# Patient Record
Sex: Female | Born: 1985 | Race: Black or African American | Hispanic: No | Marital: Single | Smoking: Never smoker
Health system: Southern US, Community
[De-identification: ages and names within clinical notes are randomized; demographics above are authoritative.]

## PROBLEM LIST (undated history)

## (undated) DIAGNOSIS — I10 Essential (primary) hypertension: Secondary | ICD-10-CM

## (undated) DIAGNOSIS — E119 Type 2 diabetes mellitus without complications: Secondary | ICD-10-CM

## (undated) HISTORY — DX: Essential (primary) hypertension: I10

## (undated) HISTORY — PX: OTHER SURGICAL HISTORY: SHX169

## (undated) HISTORY — PX: APPENDECTOMY: SHX54

---
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
## 2005-05-15 ENCOUNTER — Ambulatory Visit: Payer: Self-pay

## 2005-07-06 ENCOUNTER — Ambulatory Visit: Payer: Self-pay | Admitting: Internal Medicine

## 2005-07-24 ENCOUNTER — Ambulatory Visit: Payer: Self-pay | Admitting: Internal Medicine

## 2005-11-15 ENCOUNTER — Ambulatory Visit: Payer: Self-pay | Admitting: Internal Medicine

## 2005-11-21 ENCOUNTER — Ambulatory Visit: Payer: Self-pay | Admitting: Internal Medicine

## 2005-12-22 ENCOUNTER — Ambulatory Visit: Payer: Self-pay | Admitting: Internal Medicine

## 2006-09-09 ENCOUNTER — Emergency Department: Payer: Self-pay | Admitting: Emergency Medicine

## 2006-11-13 ENCOUNTER — Emergency Department: Payer: Self-pay | Admitting: Emergency Medicine

## 2007-01-08 ENCOUNTER — Inpatient Hospital Stay: Payer: Self-pay | Admitting: Internal Medicine

## 2007-01-23 DIAGNOSIS — E10319 Type 1 diabetes mellitus with unspecified diabetic retinopathy without macular edema: Secondary | ICD-10-CM | POA: Insufficient documentation

## 2007-01-23 DIAGNOSIS — E109 Type 1 diabetes mellitus without complications: Secondary | ICD-10-CM | POA: Insufficient documentation

## 2007-05-21 ENCOUNTER — Emergency Department: Payer: Self-pay | Admitting: Internal Medicine

## 2007-12-01 ENCOUNTER — Emergency Department: Payer: Self-pay | Admitting: Emergency Medicine

## 2007-12-11 ENCOUNTER — Encounter: Payer: Self-pay | Admitting: General Practice

## 2007-12-23 ENCOUNTER — Encounter: Payer: Self-pay | Admitting: General Practice

## 2008-01-22 ENCOUNTER — Encounter: Payer: Self-pay | Admitting: General Practice

## 2008-02-22 ENCOUNTER — Encounter: Payer: Self-pay | Admitting: General Practice

## 2008-05-02 ENCOUNTER — Emergency Department: Payer: Self-pay | Admitting: Emergency Medicine

## 2008-06-03 ENCOUNTER — Emergency Department: Payer: Self-pay | Admitting: Emergency Medicine

## 2008-08-25 ENCOUNTER — Emergency Department: Payer: Self-pay | Admitting: Internal Medicine

## 2009-03-07 ENCOUNTER — Inpatient Hospital Stay: Payer: Self-pay | Admitting: Internal Medicine

## 2009-11-03 ENCOUNTER — Emergency Department: Payer: Self-pay | Admitting: Emergency Medicine

## 2009-11-04 ENCOUNTER — Inpatient Hospital Stay: Payer: Self-pay | Admitting: Internal Medicine

## 2010-09-18 ENCOUNTER — Emergency Department: Payer: Self-pay | Admitting: Emergency Medicine

## 2010-10-25 ENCOUNTER — Emergency Department: Payer: Self-pay | Admitting: Emergency Medicine

## 2010-12-05 ENCOUNTER — Ambulatory Visit: Payer: Self-pay | Admitting: Endocrinology

## 2010-12-23 ENCOUNTER — Ambulatory Visit: Payer: Self-pay | Admitting: Endocrinology

## 2011-01-25 ENCOUNTER — Inpatient Hospital Stay: Payer: Self-pay | Admitting: Internal Medicine

## 2011-01-30 ENCOUNTER — Ambulatory Visit: Payer: Self-pay | Admitting: Endocrinology

## 2011-02-22 ENCOUNTER — Ambulatory Visit: Payer: Self-pay | Admitting: Endocrinology

## 2011-03-25 ENCOUNTER — Ambulatory Visit: Payer: Self-pay | Admitting: Endocrinology

## 2011-04-24 ENCOUNTER — Ambulatory Visit: Payer: Self-pay | Admitting: Endocrinology

## 2011-11-17 ENCOUNTER — Emergency Department: Payer: Self-pay | Admitting: Emergency Medicine

## 2011-11-17 LAB — URINALYSIS, COMPLETE
Bilirubin,UR: NEGATIVE
Blood: NEGATIVE
Leukocyte Esterase: NEGATIVE
Ph: 6 (ref 4.5–8.0)
Specific Gravity: 1.033 (ref 1.003–1.030)

## 2011-11-17 LAB — COMPREHENSIVE METABOLIC PANEL
Albumin: 3.8 g/dL (ref 3.4–5.0)
Alkaline Phosphatase: 101 U/L (ref 50–136)
BUN: 10 mg/dL (ref 7–18)
Chloride: 102 mmol/L (ref 98–107)
EGFR (Non-African Amer.): >60
Glucose: 189 mg/dL — ABNORMAL HIGH (ref 65–99)
Osmolality: 274 (ref 275–301)
SGOT(AST): 22 U/L (ref 15–37)
SGPT (ALT): 22 U/L
Sodium: 135 mmol/L — ABNORMAL LOW (ref 136–145)

## 2011-11-17 LAB — CBC
HCT: 38.7 % (ref 35.0–47.0)
MCH: 31.3 pg (ref 26.0–34.0)
MCV: 94 fL (ref 80–100)
Platelet: 390 10*3/uL (ref 150–440)
RBC: 4.14 10*6/uL (ref 3.80–5.20)

## 2011-12-31 ENCOUNTER — Emergency Department: Payer: Self-pay | Admitting: Emergency Medicine

## 2012-03-08 ENCOUNTER — Inpatient Hospital Stay: Payer: Self-pay | Admitting: Internal Medicine

## 2012-03-08 LAB — URINALYSIS, COMPLETE
Blood: NEGATIVE
Leukocyte Esterase: NEGATIVE
Nitrite: NEGATIVE
Ph: 5 (ref 4.5–8.0)
Protein: NEGATIVE
RBC,UR: 1 /HPF (ref 0–5)
WBC UR: 1 /HPF (ref 0–5)

## 2012-03-08 LAB — HEPATIC FUNCTION PANEL A (ARMC)
Albumin: 3.8 g/dL (ref 3.4–5.0)
Alkaline Phosphatase: 145 U/L — ABNORMAL HIGH (ref 50–136)
Bilirubin, Direct: 0.1 mg/dL (ref 0.00–0.20)
Bilirubin,Total: 0.6 mg/dL (ref 0.2–1.0)
SGPT (ALT): 22 U/L (ref 12–78)
Total Protein: 8.3 g/dL — ABNORMAL HIGH (ref 6.4–8.2)

## 2012-03-08 LAB — LIPASE, BLOOD: Lipase: 54 U/L — ABNORMAL LOW (ref 73–393)

## 2012-03-08 LAB — PREGNANCY, URINE: Pregnancy Test, Urine: NEGATIVE m[IU]/mL

## 2012-03-08 LAB — CBC
HCT: 41.7 % (ref 35.0–47.0)
HGB: 13.1 g/dL (ref 12.0–16.0)
MCH: 31.1 pg (ref 26.0–34.0)
MCHC: 31.4 g/dL — ABNORMAL LOW (ref 32.0–36.0)
RBC: 4.21 10*6/uL (ref 3.80–5.20)
RDW: 13.1 % (ref 11.5–14.5)
WBC: 28.9 10*3/uL — ABNORMAL HIGH (ref 3.6–11.0)

## 2012-03-08 LAB — BASIC METABOLIC PANEL
Anion Gap: 23 — ABNORMAL HIGH (ref 7–16)
Anion Gap: 9 (ref 7–16)
Calcium, Total: 9.1 mg/dL (ref 8.5–10.1)
Calcium, Total: 7.9 mg/dL — ABNORMAL LOW (ref 8.5–10.1)
Chloride: 98 mmol/L (ref 98–107)
Chloride: 112 mmol/L — ABNORMAL HIGH (ref 98–107)
Co2: 10 mmol/L — CL (ref 21–32)
Co2: 18 mmol/L — ABNORMAL LOW (ref 21–32)
Creatinine: 1.21 mg/dL (ref 0.60–1.30)
Creatinine: 0.86 mg/dL (ref 0.60–1.30)
EGFR (African American): >60
Glucose: 200 mg/dL — ABNORMAL HIGH (ref 65–99)
Osmolality: 292 (ref 275–301)
Potassium: 5.8 mmol/L — ABNORMAL HIGH (ref 3.5–5.1)

## 2012-03-08 LAB — HEMOGLOBIN: HGB: 11.5 g/dL — ABNORMAL LOW (ref 12.0–16.0)

## 2012-03-08 LAB — HEMOGLOBIN A1C: Hemoglobin A1C: 7.8 % — ABNORMAL HIGH (ref 4.2–6.3)

## 2012-03-09 LAB — CBC WITH DIFFERENTIAL/PLATELET
Basophil %: 0.3 %
Eosinophil #: 0 10*3/uL (ref 0.0–0.7)
HCT: 33 % — ABNORMAL LOW (ref 35.0–47.0)
HGB: 10.9 g/dL — ABNORMAL LOW (ref 12.0–16.0)
Lymphocyte #: 3.1 10*3/uL (ref 1.0–3.6)
Lymphocyte %: 15.3 %
MCH: 31.3 pg (ref 26.0–34.0)
MCHC: 33.1 g/dL (ref 32.0–36.0)
Monocyte #: 1.5 x10 3/mm — ABNORMAL HIGH (ref 0.2–0.9)
Neutrophil #: 15.4 10*3/uL — ABNORMAL HIGH (ref 1.4–6.5)
Platelet: 389 10*3/uL (ref 150–440)
RDW: 12.8 % (ref 11.5–14.5)

## 2012-03-09 LAB — BASIC METABOLIC PANEL
Anion Gap: 7 (ref 7–16)
BUN: 11 mg/dL (ref 7–18)
BUN: 8 mg/dL (ref 7–18)
Calcium, Total: 7.6 mg/dL — ABNORMAL LOW (ref 8.5–10.1)
Calcium, Total: 7.8 mg/dL — ABNORMAL LOW (ref 8.5–10.1)
Calcium, Total: 7.6 mg/dL — ABNORMAL LOW (ref 8.5–10.1)
Chloride: 112 mmol/L — ABNORMAL HIGH (ref 98–107)
Chloride: 113 mmol/L — ABNORMAL HIGH (ref 98–107)
Co2: 20 mmol/L — ABNORMAL LOW (ref 21–32)
Co2: 21 mmol/L (ref 21–32)
Creatinine: 0.87 mg/dL (ref 0.60–1.30)
EGFR (African American): >60
EGFR (African American): >60
EGFR (African American): >60
EGFR (Non-African Amer.): >60
EGFR (Non-African Amer.): >60
EGFR (Non-African Amer.): >60
Glucose: 87 mg/dL (ref 65–99)
Glucose: 103 mg/dL — ABNORMAL HIGH (ref 65–99)
Glucose: 187 mg/dL — ABNORMAL HIGH (ref 65–99)
Osmolality: 282 (ref 275–301)
Potassium: 3.5 mmol/L (ref 3.5–5.1)
Potassium: 3.7 mmol/L (ref 3.5–5.1)
Potassium: 3.9 mmol/L (ref 3.5–5.1)
Sodium: 142 mmol/L (ref 136–145)
Sodium: 142 mmol/L (ref 136–145)
Sodium: 141 mmol/L (ref 136–145)

## 2012-03-09 LAB — COMPREHENSIVE METABOLIC PANEL
Alkaline Phosphatase: 100 U/L (ref 50–136)
Anion Gap: 15 (ref 7–16)
Calcium, Total: 7.9 mg/dL — ABNORMAL LOW (ref 8.5–10.1)
Chloride: 111 mmol/L — ABNORMAL HIGH (ref 98–107)
Co2: 17 mmol/L — ABNORMAL LOW (ref 21–32)
EGFR (Non-African Amer.): >60
Potassium: 4 mmol/L (ref 3.5–5.1)
SGOT(AST): 8 U/L — ABNORMAL LOW (ref 15–37)
SGPT (ALT): 16 U/L (ref 12–78)
Total Protein: 5.6 g/dL — ABNORMAL LOW (ref 6.4–8.2)

## 2012-03-09 LAB — TROPONIN I
Troponin-I: <0.02 ng/mL
Troponin-I: 0.03 ng/mL

## 2012-03-09 LAB — HEMOGLOBIN: HGB: 11.2 g/dL — ABNORMAL LOW (ref 12.0–16.0)

## 2012-03-10 LAB — BASIC METABOLIC PANEL
Chloride: 109 mmol/L — ABNORMAL HIGH (ref 98–107)
Co2: 23 mmol/L (ref 21–32)
Creatinine: 0.54 mg/dL — ABNORMAL LOW (ref 0.60–1.30)
EGFR (African American): >60
Sodium: 138 mmol/L (ref 136–145)

## 2012-03-11 LAB — BASIC METABOLIC PANEL
Anion Gap: 9 (ref 7–16)
BUN: 7 mg/dL (ref 7–18)
Calcium, Total: 8.3 mg/dL — ABNORMAL LOW (ref 8.5–10.1)
Co2: 25 mmol/L (ref 21–32)
EGFR (African American): >60
EGFR (Non-African Amer.): >60
Glucose: 281 mg/dL — ABNORMAL HIGH (ref 65–99)
Osmolality: 284 (ref 275–301)
Potassium: 3.6 mmol/L (ref 3.5–5.1)

## 2012-03-11 LAB — HEMOGLOBIN: HGB: 11.4 g/dL — ABNORMAL LOW (ref 12.0–16.0)

## 2012-07-01 ENCOUNTER — Emergency Department: Payer: Self-pay | Admitting: Emergency Medicine

## 2012-07-01 LAB — CBC
HCT: 37.3 % (ref 35.0–47.0)
HGB: 12.6 g/dL (ref 12.0–16.0)
MCHC: 33.6 g/dL (ref 32.0–36.0)
MCV: 94 fL (ref 80–100)
Platelet: 360 10*3/uL (ref 150–440)
RDW: 12.7 % (ref 11.5–14.5)

## 2012-07-01 LAB — URINALYSIS, COMPLETE
Bilirubin,UR: NEGATIVE
Glucose,UR: >=500 mg/dL (ref 0–75)
Hyaline Cast: 5
Ketone: NEGATIVE
Leukocyte Esterase: NEGATIVE
Protein: 100
Specific Gravity: 1.032 (ref 1.003–1.030)
WBC UR: 2 /HPF (ref 0–5)

## 2012-07-01 LAB — COMPREHENSIVE METABOLIC PANEL
Anion Gap: 7 (ref 7–16)
BUN: 8 mg/dL (ref 7–18)
Bilirubin,Total: 0.5 mg/dL (ref 0.2–1.0)
Chloride: 104 mmol/L (ref 98–107)
Creatinine: 0.69 mg/dL (ref 0.60–1.30)
EGFR (African American): >60
EGFR (Non-African Amer.): >60
Osmolality: 283 (ref 275–301)
Potassium: 4 mmol/L (ref 3.5–5.1)
SGOT(AST): 18 U/L (ref 15–37)
Sodium: 136 mmol/L (ref 136–145)
Total Protein: 6.9 g/dL (ref 6.4–8.2)

## 2012-07-01 LAB — LIPASE, BLOOD: Lipase: 54 U/L — ABNORMAL LOW (ref 73–393)

## 2012-07-01 LAB — PREGNANCY, URINE: Pregnancy Test, Urine: NEGATIVE m[IU]/mL

## 2012-09-14 ENCOUNTER — Emergency Department: Payer: Self-pay | Admitting: Emergency Medicine

## 2012-09-14 LAB — COMPREHENSIVE METABOLIC PANEL
Albumin: 3.6 g/dL (ref 3.4–5.0)
Alkaline Phosphatase: 107 U/L (ref 50–136)
Anion Gap: 12 (ref 7–16)
Chloride: 101 mmol/L (ref 98–107)
Co2: 20 mmol/L — ABNORMAL LOW (ref 21–32)
Creatinine: 0.61 mg/dL (ref 0.60–1.30)
EGFR (African American): >60
Glucose: 325 mg/dL — ABNORMAL HIGH (ref 65–99)
SGPT (ALT): 22 U/L (ref 12–78)
Sodium: 133 mmol/L — ABNORMAL LOW (ref 136–145)
Total Protein: 7.4 g/dL (ref 6.4–8.2)

## 2012-09-14 LAB — URINALYSIS, COMPLETE
Bacteria: NONE SEEN
Bilirubin,UR: NEGATIVE
Blood: NEGATIVE
Glucose,UR: >=500 mg/dL (ref 0–75)
Leukocyte Esterase: NEGATIVE
Nitrite: NEGATIVE
Ph: 5 (ref 4.5–8.0)
RBC,UR: 1 /HPF (ref 0–5)
Squamous Epithelial: 1

## 2012-09-14 LAB — CBC
HCT: 39.8 % (ref 35.0–47.0)
HGB: 13.5 g/dL (ref 12.0–16.0)
MCV: 96 fL (ref 80–100)

## 2012-09-14 LAB — LIPASE, BLOOD: Lipase: 56 U/L — ABNORMAL LOW (ref 73–393)

## 2012-12-12 ENCOUNTER — Inpatient Hospital Stay: Payer: Self-pay | Admitting: Specialist

## 2012-12-12 ENCOUNTER — Other Ambulatory Visit: Payer: Self-pay | Admitting: Physician Assistant

## 2012-12-12 LAB — COMPREHENSIVE METABOLIC PANEL
Albumin: 3.4 g/dL (ref 3.4–5.0)
Alkaline Phosphatase: 118 U/L (ref 50–136)
Anion Gap: 9 (ref 7–16)
Anion Gap: 13 (ref 7–16)
BUN: 12 mg/dL (ref 7–18)
BUN: 17 mg/dL (ref 7–18)
Bilirubin,Total: 0.8 mg/dL (ref 0.2–1.0)
Chloride: 101 mmol/L (ref 98–107)
Chloride: 97 mmol/L — ABNORMAL LOW (ref 98–107)
Co2: 18 mmol/L — ABNORMAL LOW (ref 21–32)
Creatinine: 0.63 mg/dL (ref 0.60–1.30)
EGFR (African American): >60
EGFR (Non-African Amer.): >60
EGFR (Non-African Amer.): >60
Glucose: 359 mg/dL — ABNORMAL HIGH (ref 65–99)
Osmolality: 279 (ref 275–301)
Osmolality: 277 (ref 275–301)
Potassium: 4.3 mmol/L (ref 3.5–5.1)
SGOT(AST): 13 U/L — ABNORMAL LOW (ref 15–37)
SGOT(AST): 17 U/L (ref 15–37)
SGPT (ALT): 19 U/L (ref 12–78)
Total Protein: 7.3 g/dL (ref 6.4–8.2)
Total Protein: 7.8 g/dL (ref 6.4–8.2)

## 2012-12-12 LAB — BASIC METABOLIC PANEL
Anion Gap: 15 (ref 7–16)
BUN: 15 mg/dL (ref 7–18)
Calcium, Total: 8.4 mg/dL — ABNORMAL LOW (ref 8.5–10.1)
Creatinine: 0.78 mg/dL (ref 0.60–1.30)
EGFR (African American): >60
EGFR (Non-African Amer.): >60
Osmolality: 277 (ref 275–301)
Potassium: 4.8 mmol/L (ref 3.5–5.1)
Sodium: 131 mmol/L — ABNORMAL LOW (ref 136–145)

## 2012-12-12 LAB — CBC WITH DIFFERENTIAL/PLATELET
Basophil #: 0.1 10*3/uL (ref 0.0–0.1)
Eosinophil %: 1.1 %
MCHC: 34 g/dL (ref 32.0–36.0)
Monocyte #: 0.5 x10 3/mm (ref 0.2–0.9)
Monocyte %: 3.5 %
Neutrophil #: 11.8 10*3/uL — ABNORMAL HIGH (ref 1.4–6.5)
Neutrophil %: 83.5 %
Platelet: 390 10*3/uL (ref 150–440)
RBC: 3.94 10*6/uL (ref 3.80–5.20)
RDW: 12.9 % (ref 11.5–14.5)

## 2012-12-12 LAB — TSH: Thyroid Stimulating Horm: 0.598 u[IU]/mL

## 2012-12-12 LAB — CBC
HCT: 36.7 % (ref 35.0–47.0)
MCH: 31.5 pg (ref 26.0–34.0)
MCHC: 33.6 g/dL (ref 32.0–36.0)
MCV: 94 fL (ref 80–100)
Platelet: 412 10*3/uL (ref 150–440)
RBC: 3.92 10*6/uL (ref 3.80–5.20)
RDW: 12.7 % (ref 11.5–14.5)
WBC: 13.7 10*3/uL — ABNORMAL HIGH (ref 3.6–11.0)

## 2012-12-12 LAB — URINALYSIS, COMPLETE
Leukocyte Esterase: NEGATIVE
Ph: 5 (ref 4.5–8.0)
Protein: NEGATIVE
RBC,UR: 1 /HPF (ref 0–5)
Specific Gravity: 1.025 (ref 1.003–1.030)
Squamous Epithelial: 1

## 2012-12-12 LAB — LIPID PANEL: VLDL Cholesterol, Calc: 14 mg/dL (ref 5–40)

## 2012-12-12 LAB — HEMOGLOBIN A1C: Hemoglobin A1C: 9.1 % — ABNORMAL HIGH (ref 4.2–6.3)

## 2012-12-13 LAB — BASIC METABOLIC PANEL
BUN: 11 mg/dL (ref 7–18)
BUN: 7 mg/dL (ref 7–18)
Calcium, Total: 8 mg/dL — ABNORMAL LOW (ref 8.5–10.1)
Chloride: 110 mmol/L — ABNORMAL HIGH (ref 98–107)
Co2: 10 mmol/L — CL (ref 21–32)
EGFR (African American): >60
Glucose: 231 mg/dL — ABNORMAL HIGH (ref 65–99)

## 2013-01-19 ENCOUNTER — Emergency Department: Payer: Self-pay | Admitting: Internal Medicine

## 2013-01-19 LAB — BASIC METABOLIC PANEL
Anion Gap: 10 (ref 7–16)
Calcium, Total: 8.8 mg/dL (ref 8.5–10.1)
Chloride: 101 mmol/L (ref 98–107)
EGFR (Non-African Amer.): >60
Potassium: 4.4 mmol/L (ref 3.5–5.1)

## 2013-01-19 LAB — CBC
HCT: 38.7 % (ref 35.0–47.0)
HGB: 13.5 g/dL (ref 12.0–16.0)
MCH: 32.1 pg (ref 26.0–34.0)
MCV: 92 fL (ref 80–100)
Platelet: 320 10*3/uL (ref 150–440)
RBC: 4.19 10*6/uL (ref 3.80–5.20)
RDW: 12.6 % (ref 11.5–14.5)

## 2013-04-25 ENCOUNTER — Emergency Department: Payer: Self-pay | Admitting: Emergency Medicine

## 2013-04-25 LAB — URINALYSIS, COMPLETE
Bilirubin,UR: NEGATIVE
Glucose,UR: >=500 mg/dL (ref 0–75)
Nitrite: NEGATIVE
Protein: NEGATIVE
RBC,UR: <1 /HPF (ref 0–5)
Specific Gravity: 1.014 (ref 1.003–1.030)
Squamous Epithelial: 2
WBC UR: 1 /HPF (ref 0–5)

## 2013-04-25 LAB — BASIC METABOLIC PANEL
Anion Gap: 6 — ABNORMAL LOW (ref 7–16)
BUN: 9 mg/dL (ref 7–18)
Chloride: 108 mmol/L — ABNORMAL HIGH (ref 98–107)
Co2: 24 mmol/L (ref 21–32)
Creatinine: 0.61 mg/dL (ref 0.60–1.30)
EGFR (African American): >60
EGFR (Non-African Amer.): >60
Osmolality: 270 (ref 275–301)
Potassium: 3.5 mmol/L (ref 3.5–5.1)

## 2013-04-25 LAB — CBC
HCT: 36.8 % (ref 35.0–47.0)
HGB: 12.4 g/dL (ref 12.0–16.0)
MCHC: 33.7 g/dL (ref 32.0–36.0)
MCV: 95 fL (ref 80–100)
Platelet: 440 10*3/uL (ref 150–440)
RBC: 3.86 10*6/uL (ref 3.80–5.20)
WBC: 13.6 10*3/uL — ABNORMAL HIGH (ref 3.6–11.0)

## 2013-07-18 ENCOUNTER — Emergency Department: Payer: Self-pay | Admitting: Emergency Medicine

## 2013-07-18 LAB — RAPID INFLUENZA A&B ANTIGENS

## 2013-07-19 ENCOUNTER — Emergency Department: Payer: Self-pay | Admitting: Emergency Medicine

## 2013-07-19 LAB — URINALYSIS, COMPLETE
Bilirubin,UR: NEGATIVE
Blood: NEGATIVE
Glucose,UR: >=500 mg/dL (ref 0–75)
Hyaline Cast: 6
Nitrite: NEGATIVE
Ph: 5 (ref 4.5–8.0)
Protein: 100
Specific Gravity: 1.024 (ref 1.003–1.030)
Squamous Epithelial: 6

## 2013-09-11 ENCOUNTER — Inpatient Hospital Stay: Payer: Self-pay | Admitting: Internal Medicine

## 2013-09-11 LAB — CBC
HCT: 35.5 % (ref 35.0–47.0)
HGB: 11.6 g/dL — AB (ref 12.0–16.0)
MCH: 31.3 pg (ref 26.0–34.0)
MCHC: 32.5 g/dL (ref 32.0–36.0)
MCV: 96 fL (ref 80–100)
PLATELETS: 405 10*3/uL (ref 150–440)
RBC: 3.69 10*6/uL — AB (ref 3.80–5.20)
RDW: 12.7 % (ref 11.5–14.5)
WBC: 13.2 10*3/uL — ABNORMAL HIGH (ref 3.6–11.0)

## 2013-09-11 LAB — COMPREHENSIVE METABOLIC PANEL
ALT: 24 U/L (ref 12–78)
Albumin: 3.2 g/dL — ABNORMAL LOW (ref 3.4–5.0)
Alkaline Phosphatase: 121 U/L — ABNORMAL HIGH
Anion Gap: 5 — ABNORMAL LOW (ref 7–16)
BUN: 12 mg/dL (ref 7–18)
Bilirubin,Total: 0.2 mg/dL (ref 0.2–1.0)
CALCIUM: 8.6 mg/dL (ref 8.5–10.1)
CO2: 29 mmol/L (ref 21–32)
CREATININE: 0.64 mg/dL (ref 0.60–1.30)
Chloride: 100 mmol/L (ref 98–107)
EGFR (African American): >60
EGFR (Non-African Amer.): >60
GLUCOSE: 124 mg/dL — AB (ref 65–99)
Osmolality: 269 (ref 275–301)
Potassium: 4.6 mmol/L (ref 3.5–5.1)
SGOT(AST): 19 U/L (ref 15–37)
Sodium: 134 mmol/L — ABNORMAL LOW (ref 136–145)
Total Protein: 7.2 g/dL (ref 6.4–8.2)

## 2013-09-12 LAB — BASIC METABOLIC PANEL
Anion Gap: 7 (ref 7–16)
BUN: 7 mg/dL (ref 7–18)
CALCIUM: 8.7 mg/dL (ref 8.5–10.1)
CO2: 26 mmol/L (ref 21–32)
CREATININE: 0.64 mg/dL (ref 0.60–1.30)
Chloride: 102 mmol/L (ref 98–107)
EGFR (African American): >60
EGFR (Non-African Amer.): >60
Glucose: 150 mg/dL — ABNORMAL HIGH (ref 65–99)
OSMOLALITY: 271 (ref 275–301)
Potassium: 3.7 mmol/L (ref 3.5–5.1)
Sodium: 135 mmol/L — ABNORMAL LOW (ref 136–145)

## 2013-09-12 LAB — CBC WITH DIFFERENTIAL/PLATELET
BASOS PCT: 0.6 %
Basophil #: 0.1 10*3/uL (ref 0.0–0.1)
Eosinophil #: 0.2 10*3/uL (ref 0.0–0.7)
Eosinophil %: 2.4 %
HCT: 33.5 % — ABNORMAL LOW (ref 35.0–47.0)
HGB: 11.4 g/dL — AB (ref 12.0–16.0)
LYMPHS ABS: 3.2 10*3/uL (ref 1.0–3.6)
LYMPHS PCT: 33.1 %
MCH: 32.6 pg (ref 26.0–34.0)
MCHC: 34 g/dL (ref 32.0–36.0)
MCV: 96 fL (ref 80–100)
Monocyte #: 0.7 x10 3/mm (ref 0.2–0.9)
Monocyte %: 7.5 %
Neutrophil #: 5.4 10*3/uL (ref 1.4–6.5)
Neutrophil %: 56.4 %
PLATELETS: 361 10*3/uL (ref 150–440)
RBC: 3.49 10*6/uL — AB (ref 3.80–5.20)
RDW: 12.7 % (ref 11.5–14.5)
WBC: 9.7 10*3/uL (ref 3.6–11.0)

## 2013-09-12 LAB — URINALYSIS, COMPLETE
Bilirubin,UR: NEGATIVE
Blood: NEGATIVE
Glucose,UR: 150 mg/dL (ref 0–75)
KETONE: NEGATIVE
LEUKOCYTE ESTERASE: NEGATIVE
NITRITE: NEGATIVE
PH: 8 (ref 4.5–8.0)
Protein: NEGATIVE
Specific Gravity: 1.008 (ref 1.003–1.030)
Squamous Epithelial: 1
WBC UR: 1 /HPF (ref 0–5)

## 2013-09-12 LAB — HEMOGLOBIN A1C: Hemoglobin A1C: 6.8 % — ABNORMAL HIGH (ref 4.2–6.3)

## 2013-09-17 LAB — CULTURE, BLOOD (SINGLE)

## 2013-12-04 ENCOUNTER — Other Ambulatory Visit: Payer: Self-pay | Admitting: Obstetrics and Gynecology

## 2013-12-04 LAB — HEMOGLOBIN A1C: Hemoglobin A1C: 7.4 % — ABNORMAL HIGH (ref 4.2–6.3)

## 2013-12-04 LAB — HCG, QUANTITATIVE, PREGNANCY: Beta Hcg, Quant.: 281 m[IU]/mL — ABNORMAL HIGH

## 2013-12-15 ENCOUNTER — Emergency Department: Payer: Self-pay | Admitting: Emergency Medicine

## 2013-12-15 LAB — CBC
HCT: 35.3 % (ref 35.0–47.0)
HGB: 11.8 g/dL — ABNORMAL LOW (ref 12.0–16.0)
MCH: 31.5 pg (ref 26.0–34.0)
MCHC: 33.3 g/dL (ref 32.0–36.0)
MCV: 95 fL (ref 80–100)
PLATELETS: 424 10*3/uL (ref 150–440)
RBC: 3.74 10*6/uL — AB (ref 3.80–5.20)
RDW: 12.7 % (ref 11.5–14.5)
WBC: 10 10*3/uL (ref 3.6–11.0)

## 2013-12-15 LAB — URINALYSIS, COMPLETE
Bacteria: NONE SEEN
Bilirubin,UR: NEGATIVE
GLUCOSE, UR: NEGATIVE mg/dL (ref 0–75)
KETONE: NEGATIVE
Leukocyte Esterase: NEGATIVE
NITRITE: NEGATIVE
Ph: 6 (ref 4.5–8.0)
Protein: NEGATIVE
RBC,UR: NONE SEEN /HPF (ref 0–5)
Specific Gravity: 1.019 (ref 1.003–1.030)
Squamous Epithelial: <1
WBC UR: NONE SEEN /HPF (ref 0–5)

## 2013-12-15 LAB — HCG, QUANTITATIVE, PREGNANCY: Beta Hcg, Quant.: 1872 m[IU]/mL — ABNORMAL HIGH

## 2013-12-16 ENCOUNTER — Other Ambulatory Visit: Payer: Self-pay | Admitting: Obstetrics and Gynecology

## 2013-12-16 LAB — HCG, QUANTITATIVE, PREGNANCY: Beta Hcg, Quant.: 487 m[IU]/mL — ABNORMAL HIGH

## 2013-12-23 ENCOUNTER — Other Ambulatory Visit: Payer: Self-pay | Admitting: Obstetrics and Gynecology

## 2013-12-23 LAB — HCG, QUANTITATIVE, PREGNANCY: Beta Hcg, Quant.: 14 m[IU]/mL — ABNORMAL HIGH

## 2014-05-26 ENCOUNTER — Ambulatory Visit: Payer: Self-pay | Admitting: Obstetrics and Gynecology

## 2014-05-26 LAB — HCG, QUANTITATIVE, PREGNANCY: Beta Hcg, Quant.: <1 m[IU]/mL — ABNORMAL LOW

## 2014-07-01 IMAGING — CR DG CHEST 2V
1 series · 2 of 2 positions shown · non-contrast
Comparison: none

REASON FOR EXAM: DKA, WBC 28.9, Mild dyspnea
COMMENTS:

[Series 1: w chest pa · 0.14mm/px · 2 of 2 slices shown]
[im 1/2]
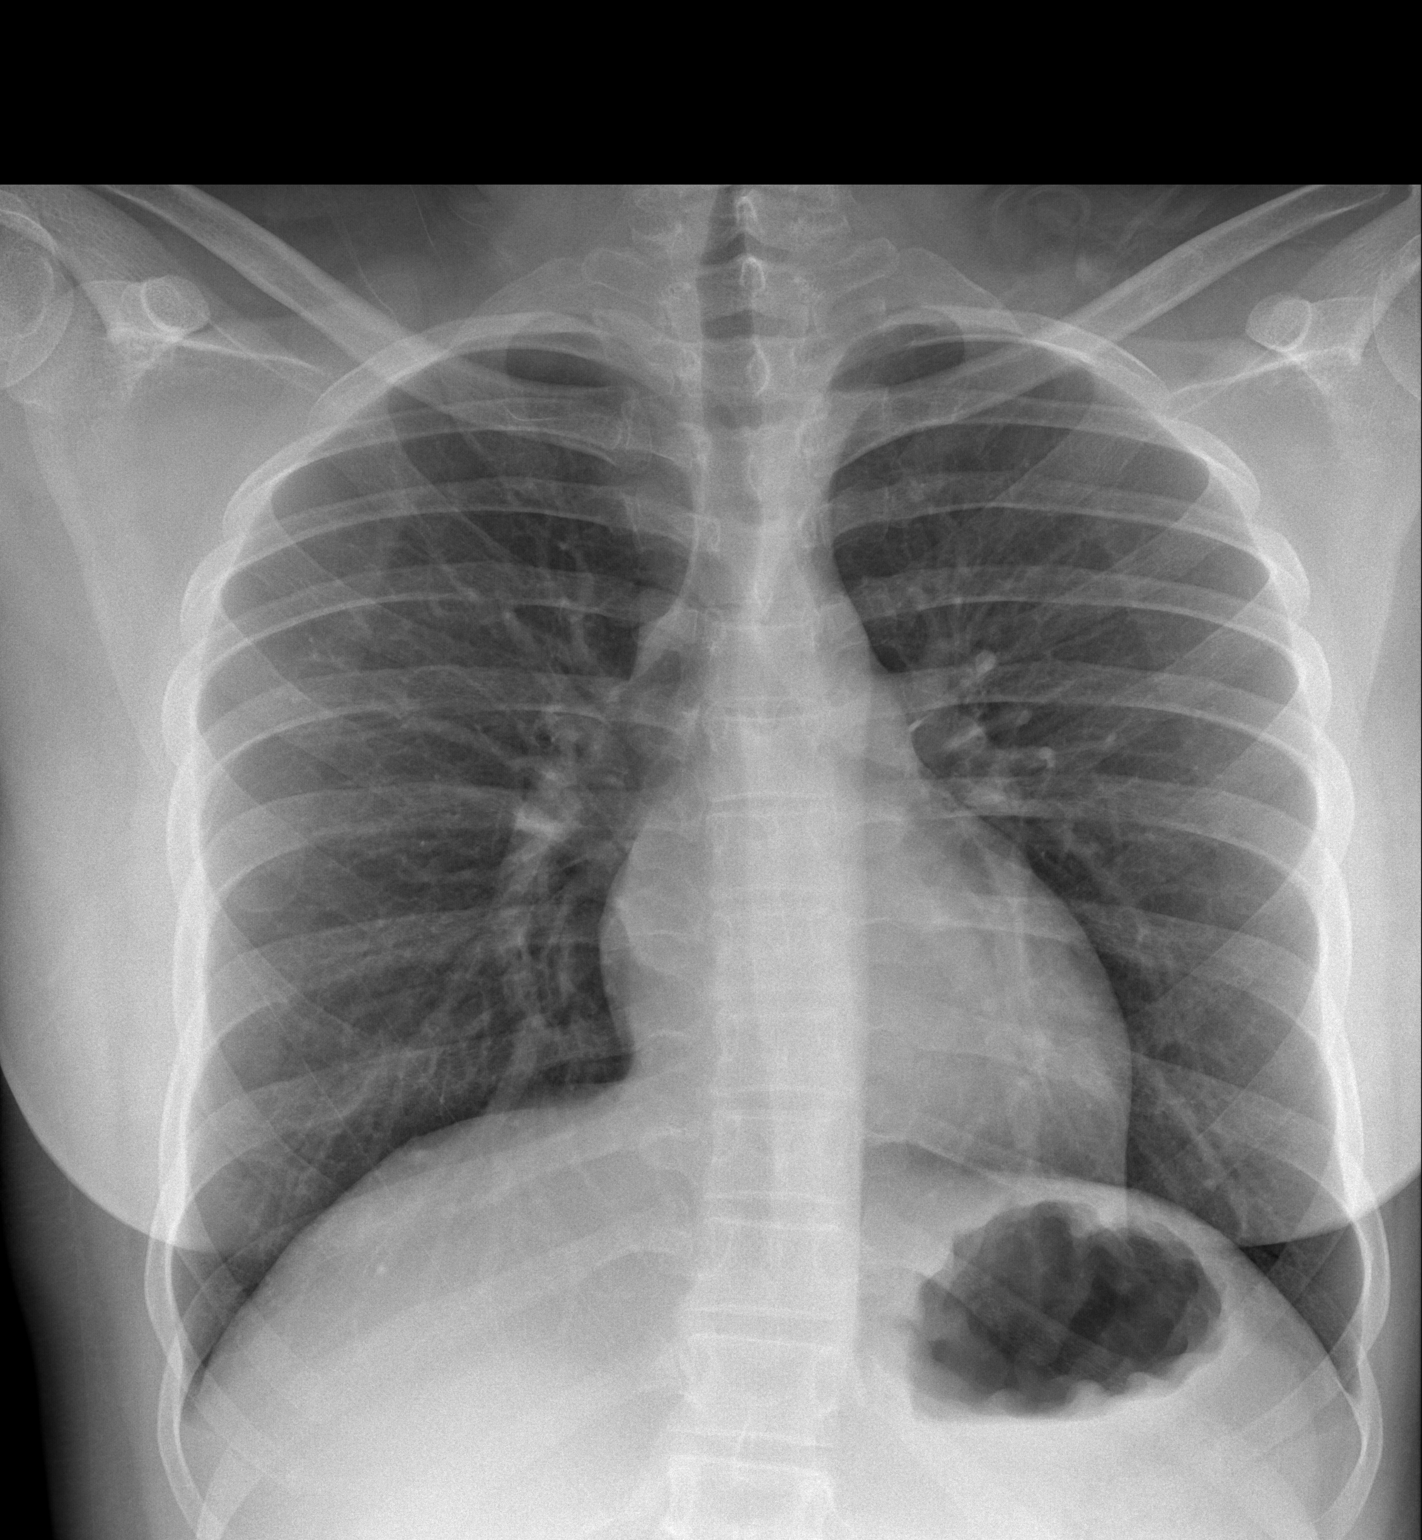
[im 2/2]
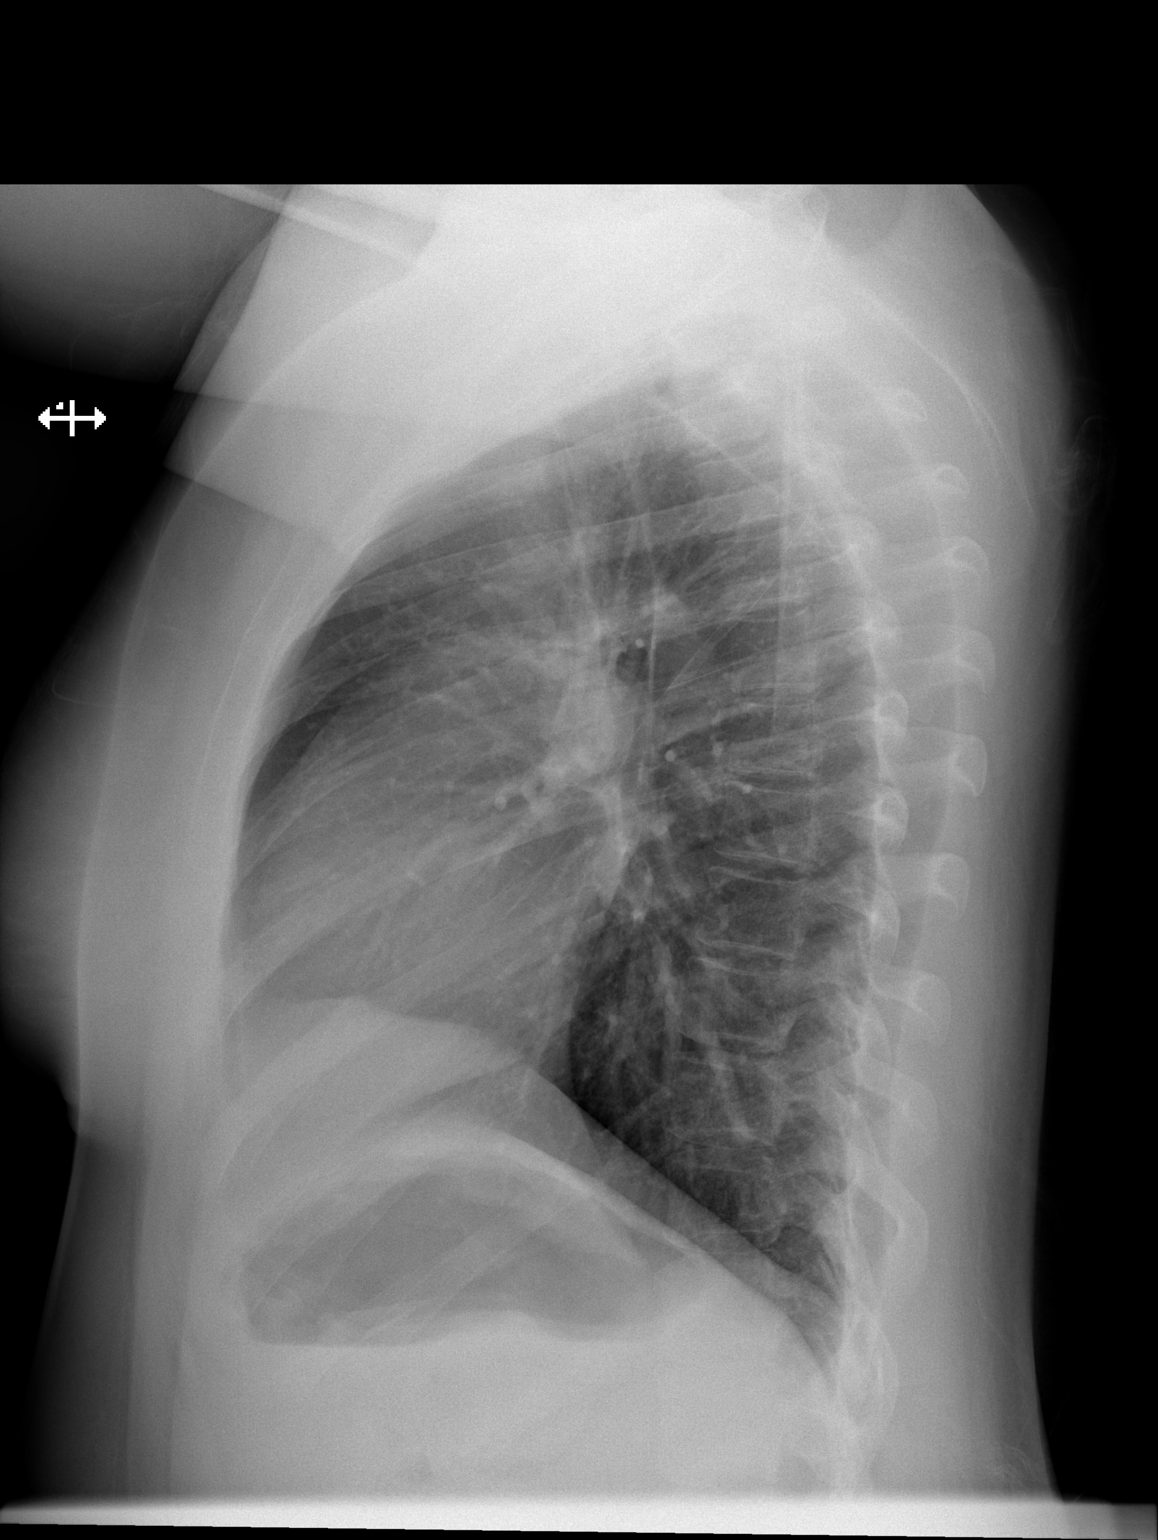

[2 of 2 positions shown; findings below may reference images not displayed]

PROCEDURE:     DXR - DXR CHEST PA (OR AP) AND LATERAL  - March 08, 2012  [DATE]

RESULT:     Comparison is made to the study 05 November, 2009.

The lungs are well-expanded. The cardiac silhouette is normal in size. The
pulmonary vascularity is not engorged. There is no pleural effusion or
pneumothorax. The mediastinum is normal in width. The bony thorax exhibits
no acute abnormality.
IMPRESSION: There is mild hyperinflation which may reflect underlying
reactive airway disease and acute bronchitis. There is no evidence of
pneumonia.

[REDACTED]

## 2014-09-24 ENCOUNTER — Other Ambulatory Visit: Payer: Self-pay | Admitting: Obstetrics and Gynecology

## 2014-09-25 ENCOUNTER — Emergency Department: Payer: Self-pay | Admitting: Emergency Medicine

## 2014-11-10 NOTE — Discharge Summary (Signed)
PATIENT NAME:  Katie Griffin, Katie Griffin MR#:  [id number] DATE OF BIRTH:  [birth year]  DATE OF ADMISSION:  03/08/2012 DATE OF DISCHARGE:  03/11/2012  ADMITTING PHYSICIAN: Dr. Katharina Caperima Vaickute   DISCHARGING PHYSICIAN: Dr. Enid Baasadhika Joevanni Roddey   PRIMARY ENDOCRINOLOGIST: Dr. Elsie SaasSolum   CONSULTATION IN THE HOSPITAL: Endocrinology consultation by Dr. Tedd SiasSolum    DISCHARGE DIAGNOSES:  1. Diabetic ketoacidosis.  2. Type I diabetes mellitus, on insulin pump.  3. Acute viral gastroenteritis.  4. Hypokalemia.  5. Hematemesis due to recurrent vomiting on admission.   DISCHARGE MEDICATIONS:  1. The patient is on insulin pump. 2. Cipro 500 mg p.o. b.i.d. for three more days.   DISCHARGE DIET: ADA carbohydrate controlled 1800 calorie diet.   DISCHARGE ACTIVITY: As tolerated.    FOLLOW-UP INSTRUCTIONS: Follow-up with Dr. Tedd SiasSolum in 2 to 3 weeks.   LABS AND IMAGING STUDIES: Hemoglobin 11.4 prior to discharge. WBC was 20.1 on admission. It improved with fluid. Sodium 138, potassium 3.9, chloride 109, bicarb 23, BUN 5, creatinine 0.5, glucose 270, calcium 7.8.   Chest x-ray on admission showing hyperinflation, possible reactive airway disease or bronchitis.   Glucose was more than 500 on admission and anion gap was 23.   BRIEF HOSPITAL COURSE: [name] is a 29 year old African American female with known history of type I diabetes mellitus on insulin pump who follows up regularly with Dr. Tedd SiasSolum was brought in secondary to nausea, vomiting, and abdominal pain. Her sugars were greater than 500 and she was found to be in DKA. 1. DKA, could be triggered by the viral gastroenteritis. She was started on insulin IV drip in the hospital with closure of her anion gap. She was seen by endocrinologist, Dr. Tedd SiasSolum, to help adjust her insulin pump. Once her gap was closed, she was restarted back on her insulin pump at her home doses. Her recent HbA1c was 7.8 by Dr. Pricilla HandlerSolum's notes. Her sugars have been in the 200 range so Dr. Tedd SiasSolum  prior to discharge has adjusted her settings. Her basal rates at 12 a.m. should be 1.05 units/h and at 1 p.m. is 1.15 units per hour and at 7 p.m. is 1.2 units per hour so the new 24 hour basal rate is calculated to be 26.5 units. The patient will follow-up with Dr. Tedd SiasSolum as an outpatient in the next 2 to 3 weeks.  2. Viral gastroenteritis. Symptoms resolved within one day. She was started on Cipro for her abdominal pain, tenderness and she seemed to have symptomatically improved so Cipro will be continued for another three days to finish off the course. Her course has been otherwise uneventful in the hospital. Her electrolytes were replaced as needed. She was hyperkalemic on admission due to DKA and then potassium was low and that was replaced.  3. Hematemesis from constant vomiting and retching. She had mild streaks of blood on admission. Hemoglobin, however, stayed stable and has not had further episodes of hematemesis while in the hospital.   DISCHARGE CONDITION: Stable.   DISCHARGE DISPOSITION: Home.   TIME SPENT ON DISCHARGE: 45 minutes.   ____________________________ Enid Baasadhika Daviel Allegretto, MD rk:drc D: 03/11/2012 14:59:32 ET T: 03/12/2012 12:06:44 ET JOB#: [id number]  cc: Enid Baasadhika Dasie Chancellor, MD, <Dictator> A. Wendall MolaMelissa Solum, MD Enid BaasADHIKA Rubyann Lingle MD ELECTRONICALLY SIGNED 03/13/2012 12:44

## 2014-11-10 NOTE — Consult Note (Signed)
Chief Complaint and History:   Referring Physician Dr. Ether Griffins    Chief Complaint DKA    History of Present Illness 29 yo AAF with h/o type 1 diabetes seen in consultation for DKA. She has had 1 day of N/V/fevers/headache. Labs showed inital BG 563, creatinine 1.2, bicarb 10, AG 23, WBC 28.9, urine glucose >500 and 2+ urinary ketones c/w DKA. Her current Hgb A1c is 7.8%. Her prior A1c in April was 4.1%. Diabetes has been well controlled recently. She was admitted last year one time with DKA. She has regular clinic follow-up with me and was last seen in April.  Patient has a [employer] insulin pump. Current settings on pump are: Basal rates: 12 AM 0.925 units/hour, 1 PM 0.95 units/hour (24-hour total basal 22.475 units). Bolus settings are: I: C at midnight 10, at 6 PM 8, Sensitivity 45, Target 90-120. Insulin active time 4 hours She last changed her basal settings one week ago because she was having frequent low sugars. She typically checks her sugars 3 times daily.    Past History Medical history: Type 1 diabetes  A. diagnosed age 70  B. Hospitalization for DKA 01/2011  Surgical history: Appendectomy, 1999  Medications: Humalog (2 vials per month) Kariva OCP  Social history: She is single. She lives with her mother. She does not smoke cigarettes or drink alcohol.  Family history: Diabetes and heart disease   Allergies:  No Known Allergies:   Review of Systems:   General see HPI    Skin negative    Breast negative    Ophthalmologic negative    ENMT negative    Respiratory and Thorax negative    Cardiovascular negative    Gastrointestinal see HPI    Musculoskeletal negative    Neurological negative    Psychiatric negative    Endocrine see HPI   Physical Exam:   General WD WN F in NAD    Skin No rash    Eyes EOMI    ENMT OP clear, MMM    Head and Neck Supple    Respiratory and Thorax Clear b/l, no wheeze    Cardiovascular Tachycardic, no murmur     Gastrointestinal Soft, NT    Musculoskeletal Nml motor tone, no leg edema    Psychiatric A&O x3   Laboratory Results:  Hepatic:  16-Aug-13 12:10    Bilirubin, Total 0.6   Bilirubin, Direct 0.1 (Result(s) reported on 08 Mar 2012 at 03:27PM.)   Alkaline Phosphatase  145   SGPT (ALT) 22   SGOT (AST) 27   Total Protein, Serum  8.3   Albumin, Serum 3.8  Routine Chem:  16-Aug-13 12:10    Lipase  54 (Result(s) reported on 08 Mar 2012 at 03:27PM.)   Hemoglobin A1c ([facility])  7.8 (The American Diabetes Association recommends that a primary goal of therapy should be <7% and that physicians should reevaluate the treatment regimen in patients with HbA1c values consistently >8%.)   Glucose, Serum  563   BUN  23   Creatinine (comp) 1.21   Sodium, Serum  131   Potassium, Serum  5.8   Chloride, Serum 98   CO2, Serum  10   Anion Gap  23   Osmolality (calc) 292   eGFR (African American) >60   eGFR (Non-African American) >60 (eGFR values <35mL/min/1.73 m2 may be an indication of chronic kidney disease (CKD). Calculated eGFR is useful in patients with stable renal function. The eGFR calculation will not be reliable in acutely ill patients  when serum creatinine is changing rapidly. It is not useful in  patients on dialysis. The eGFR calculation may not be applicable to patients at the low and high extremes of body sizes, pregnant women, and vegetarians.)   Result Comment GLUCOSE,CO2 - RESULTS VERIFIED BY REPEAT TESTING.  - [email or web address]  - NOTIFIED OF CRITICAL VALUE  - READ-BACK PROCESS PERFORMED. ANION GAP - ELECTROLYTES REPEATED.  Result(s) reported on 08 Mar 2012 at 12:53PM.  Cardiac:  16-Aug-13 12:10    Troponin I < 0.02 (0.00-0.05 0.05 ng/mL or less: NEGATIVE  Repeat testing in 3-6 hrs  if clinically indicated. >0.05 ng/mL: POTENTIAL  MYOCARDIAL INJURY. Repeat  testing in 3-6 hrs if  clinically indicated. NOTE: An increase or decrease  of 30% or more on  serial  testing suggests a  clinically important change)  Routine UA:  16-Aug-13 12:10    Color (UA) Straw   Clarity (UA) Hazy   Glucose (UA) >=500   Bilirubin (UA) Negative   Ketones (UA) 2+   Specific Gravity (UA) 1.017   Blood (UA) Negative   pH (UA) 5.0   Protein (UA) Negative   Nitrite (UA) Negative   Leukocyte Esterase (UA) Negative (Result(s) reported on 08 Mar 2012 at 12:41PM.)   RBC (UA) 1 /HPF   WBC (UA) 1 /HPF   Bacteria (UA) TRACE   Epithelial Cells (UA) <1 /HPF   Mucous (UA) PRESENT   Hyaline Cast (UA) 3 /LPF (Result(s) reported on 08 Mar 2012 at 12:41PM.)  Routine Sero:  16-Aug-13 12:10    Pregnancy Test, Urine NEGATIVE (The results of the qualitative urine HCG (Pregnancy Test) should be evaluated in light of other clinical information.  There are limitations to the test which, in certain clinical situations, may result in a false positive or negative result. Thehigh dose hook effect can occur in urine samples with extremely high HCG concentrations.  This effect can produce a negative result in certain situations. It is suggested that results of the qualitative HCG be confirmed by an alternate methodology, such as the quantitative serum beta HCG test.)  Routine Hem:  16-Aug-13 12:10    WBC (CBC)  28.9   RBC (CBC) 4.21   Hemoglobin (CBC) 13.1   Hematocrit (CBC) 41.7   Platelet Count (CBC)  492 (Result(s) reported on 08 Mar 2012 at 01:05PM.)   MCV 99   MCH 31.1   MCHC  31.4   RDW 13.1   Radiology Results: XRay:    16-Aug-13 13:36, Chest PA and Lateral   Chest PA and Lateral   REASON FOR EXAM:    DKA, WBC 28.9, Mild dyspnea  COMMENTS:       PROCEDURE: DXR - DXR CHEST PA (OR AP) AND LATERAL  - Mar 08 2012  1:36PM     RESULT: Comparison is made to the study of 05 November 2009.    The lungs are well-expanded. The cardiac silhouette is normal in size.   The pulmonary vascularity is not engorged. There is no pleural effusion   or pneumothorax. The  mediastinum is normal in width. The bony thorax   exhibits no acute abnormality.    IMPRESSION:  There is mild hyperinflation which may reflect underlying   reactive airway disease and acute bronchitis. There is no evidence of   pneumonia.   Dictation Site: 2          Verified By: DAVID A. Martinique, M.D., MD   Assessment/Plan:   Orders/Results reviewed Orders reviewed, no changes at this  time    Assessment/Plan Young F with type 1 diabetes admitted with DKA in setting of possible gastroenteritis.   1) DKA - agree with plan for DKA insulin drip protocol and suspending her pump. Typical insulin drip protocol can be followed but when she is ready for transition to SQ insulin, I would instead have her re-start her insulin pump. Patient agreed to ask her father to bring in her supplies and keep her glucometer at her bedside. The IV insulin can be stopped one hour after her pump is restarted. She then can bolus as needed and manage her own pump and check her sugars ad lib. If sugars rise over the initial day after restarting pump, this may indicate pump malfunction in which case she may need to do subQ injections of insulin. A reasonable subQ regimen would be Lantus 22 units q24 hrs and NovoLog 5 units tid AC + sliding scale of 1 unit per sugar of 50 over a target of 150.  I will be unavailable over the weekend to see the patient but I am available for questions by page, if needed. If she remains hospitalized on Monday then I will see her then.    Case Discussed With patient, nursing   Electronic Signatures: Judi Cong (MD)  (Signed 16-Aug-13 18:55)  Authored: Chief Complaint and History, ALLERGIES, HOME MEDICATIONS, Review of Systems, Physcial Exam, LABS, RADIOLOGY, Assessment/Plan   Last Updated: 16-Aug-13 18:55 by Judi Cong (MD)

## 2014-11-10 NOTE — H&P (Signed)
PATIENT NAME:  Katie Griffin, Katie Griffin MR#:  [id number] DATE OF BIRTH:  12-28-1985  DATE OF ADMISSION:  03/08/2012  PRIMARY CARE PHYSICIAN: Dr. Tedd SiasSolum.   HISTORY OF PRESENT ILLNESS:  The patient is a 29 year old African American female who presented to the hospital with history of diabetes mellitus type 1 since age of 29 who presented to the hospital with complaints of nausea, vomiting as well as diarrhea which happened last night. Apparently, the patient did well up until yesterday. All day yesterday she had her sugars running at around 200s and 300s. However, last night after meal in the evening she started having nausea and vomiting as well as diarrhea which lasted all night long. She felt somewhat weak and had pains in the lower abdomen, felt as if the abdomen area would tense up and she would become really nauseated and then later on the pain would become crampy. She stated that initially the pain would subside after vomiting, however, later on it would become really crampy. She had diarrhea at least three times. Initially, it was soft stool, but later on it was mushy at least x2. She also noted a small amount of blood in her vomitus, just a streak. She admitted of having some chest pains, especially whenever she stood up. She noted that her heart rate would go up whenever she stands up and then she would have chest pain. Pain was described as achiness and it was intermittent. It was going on since 8:00 p.m. Because of nausea, vomiting and episodes of diarrhea, as well as chest pains and not feeling well, she decided to come to the Emergency Room for further evaluation. She admits of having increased frequency of urination over the past two days. 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 However, I do not see any hemoglobin A1c on the recent labs. The most recent labs are from 10/2011.   PAST MEDICAL HISTORY:   1. History of diabetic ketoacidosis. Admission for the same in 01/2011. Hemoglobin A1c at that time was 8.6. History of diabetes mellitus type 1 since age 29.  2. History of hyponatremia and dehydration on the same admission in 01/2011 as well as tachycardia.   MEDICATIONS: Humalog insulin pump. Settings: The patient tells me that it was as before from 12 midnight 0.95 units an hour, from 1:00 p.m. 1.1 units an hour, from 7:00 p.m. 1.2 units an hour, 9:00 p.m. 1 unit an hour. Apparently, her carbohydrate ratio was 1 to 10. Sensitivity was 50.   PAST SURGICAL HISTORY:   1. Appendectomy. 2. Humalog insulin pump placed.   SOCIAL HISTORY: No smoking, alcohol or drug abuse. She works at [employer] department at [employer].   FAMILY HISTORY: The patient's mother has diabetes. The patient's father had a stroke at age of 29. He also has hypertension. The patient's mother has hypertension.   REVIEW OF SYSTEMS: Positive for weakness, pains in the lower abdomen, nausea, vomiting, diarrhea, a few streaks of blood in her vomitus, increasing heart rate as well as chest pains intermittently whenever she stands up, increased frequency of urination. CONSTITUTIONAL: Otherwise, denies fevers, chills, weight loss or gain. EYES: Denies blurry vision, double vision, glaucoma, or cataracts. ENT: Denies tinnitus, epistaxis, sinus pain, dentures or difficulty swallowing. RESPIRATORY: Denies any cough, wheezes, asthma or chronic obstructive pulmonary disease. CARDIOVASCULAR: Denies orthopnea, edema, arrhythmias, palpitations or syncope. GASTROINTESTINAL: Denies any melena, bright  red blood per rectum. No change in bowel habits except as mentioned above. No hemorrhoids or hernia. GENITOURINARY: Denies dysuria, hematuria, incontinence. ENDOCRINE: Admits of polydipsia, however, again was not able to drink much over the past 12 hours. Denies any thyroid problems, heat or cold intolerance, or thirst. HEMATOLOGIC:  Denies anemia, easy bruising, bleeding or swollen glands. SKIN: Denies any acne, rashes, lesions, or change in moles. MUSCULOSKELETAL: Denies arthritis, cramps, swelling, or gout. NEUROLOGIC: Denies numbness, weakness or tremor. PSYCHIATRIC: Denies anxiety, insomnia or depression.   PHYSICAL EXAMINATION:   VITAL SIGNS: On arrival to the hospital, the patient's vitals were temperature 98.1, pulse 111, respirations 18, blood pressure 126/57, saturation 97% on room air.   GENERAL: This is a well nourished African American female in no significant distress lying on the stretcher.   HEENT: Her pupils are equal and reactive to light. Extraocular movements intact. Normal hearing. No pharyngeal erythema. Mucosa is dry.   NECK: No masses, supple, nontender. Thyroid is not enlarged. No adenopathy, no JVD or carotid bruits.   LUNGS: Clear to auscultation in all fields. No rales, rhonchi, wheezing or labored inspiration, decreased effort, dullness to percussion or overt respiratory distress.     CARDIOVASCULAR: S1, S2 appreciated. No murmurs, rubs, or gallops were noted. Tachycardic. PMI not lateralized. Chest is nontender to palpation.   EXTREMITIES: 1+ pedal pulses. No lower extremity edema, calf tenderness, or cyanosis.   ABDOMEN: Soft, tender in suprapubic area as well as bilateral lower quadrants, more on the left side. No rebound or guarding was noted. No hepatosplenomegaly or masses were noted.   RECTAL: Deferred.   MUSCULOSKELETAL: Muscle strength able to move all extremities. No significant degenerative joint disease or kyphosis. Gait is not tested.    SKIN: Did not reveal any rashes, lesions, erythema, nodularity, or induration. It was warm and dry to palpation. No adenopathy in the cervical region.   NEUROLOGICAL: Cranial nerves grossly intact. Sensory is intact. No dysarthria or aphasia. The patient is alert and oriented to time, person and place. Cooperative. Memory is good. No significant  confusion, agitation, or depression.   LABORATORY, RADIOLOGICAL AND DIAGNOSTIC DATA: BMP showed a glucose of 563, BUN 23, sodium 131, potassium 5.8. Bicarb level 10. Anion gap was 23. The patient's white blood cell count was elevated to 28.9, hemoglobin 13.1, platelet count 492. Urinalysis: Straw hazy urine, more than 500 glucose, negative for bilirubin, 2+ ketones, specific gravity 1.017, pH 5.0, negative for blood, protein, nitrites, leukocyte esterase, one red blood cell and one white blood cell, trace bacteria, less than one epithelial cell. Mucous was present as well as three hyaline casts. Negative pregnancy test in urine. EKG is not done yet. Chest x-ray PA and lateral 03/08/2012 showed mild hyperinflation which may reflect underlying reactive airway disease or acute bronchitis. No evidence of pneumonia.   ASSESSMENT AND PLAN:  1. DKA in patient's with diabetes mellitus type 1. Admit the patient to the medical floor to Critical Care Unit. Continue IV fluids as well as insulin drip. Following BMP every four hours until bicarbonate is at least 20 and anion gap has closed.  2. Acute gastroenteritis, questionable reaction to acidosis. Get cultures and start the patient on ciprofloxacin for now.  3. Dehydration. Continue IV fluids.  4. Hyperkalemia. Continue insulin as well as IV fluids and following labs.  5. Chest pain with tachycardia. Will continue IV fluids. We will check cardiac enzymes x3. We will get aspirin, however, we are not able to do heparin due  to a small amount of gastrointestinal bleed with vomiting.  6. Hematemesis, minimal amount, just streaks, possible Mallory-Weiss tear. Will continue PPIs, IV b.i.d. as well as hemoglobin to be checked every eight hours.   TIME SPENT: 50 minutes.   ____________________________ Katharina Caper, MD rv:ap D: 03/08/2012 14:16:05 ET T: 03/08/2012 15:57:11 ET JOB#: [id number]  cc: Katharina Caper, MD, <Dictator> A. Wendall Mola, MD Katharina Caper  MD ELECTRONICALLY SIGNED 03/23/2012 15:22

## 2014-11-13 NOTE — H&P (Signed)
PATIENT NAME:  Katie Griffin, Katie Griffin MR#:  [id number] DATE OF BIRTH:  03-28-1986  DATE OF ADMISSION:  12/12/2012  PRIMARY CARE PHYSICIAN: None.  The patient is going to start seeing MD at [facility] internal medicine soon.   ENDOCRINOLOGIST:  The patient was dismissed from Dr. Pricilla HandlerSolum's practice.   CHIEF COMPLAINT: Elevated sugars.   HISTORY OF PRESENT ILLNESS:  Katie Griffin is a pleasant 29 year old African American female who type 1 diabetes on insulin pump, comes the Emergency Room after she noted her sugars have been running in the 200s to 300s and was found to have sugars in the 400s at home today.  She came to the Emergency Room, sugars were in the 460s, received some IV insulin and her sugars dropped down into the 300s. She has normal anion gap; however, her acidosis got worse with bicarbonate getting worse from 19 and went down to 13. The patient was started on IV fluids and her medicine was consulted for. I was asked to see the patient for admission secondary to with DKA normal anion gap and acidosis and type 1 diabetes. The patient was also found to have bacterial vaginosis. She is being admitted for further evaluation and management.   PAST MEDICAL HISTORY:  Type 1 diabetes on insulin pump.   FAMILY HISTORY: Diabetes in grandmother.   SOCIAL HISTORY: She is nonsmoker. Does not do any drugs. 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   MEDICATIONS:  Insulin pump.   REVIEW OF SYSTEMS:  CONSTITUTIONAL: Positive for fatigue, weakness.  EYES: No blurred or double vision.  ENT: No tinnitus, ear pain, hearing loss.  RESPIRATORY: No cough, wheeze, hemoptysis.  CARDIOVASCULAR: No chest pain, orthopnea, edema or palpitations.   GASTROINTESTINAL: No nausea, vomiting, diarrhea, abdominal pain or GERD.   GENITOURINARY: No dysuria, hematuria.  ENDOCRINE: Positive for diabetes. No polyuria or nocturia.  HEMATOLOGY: No anemia or easy bruising.  SKIN: No acne or rash.   MUSCULOSKELETAL: No arthritis.  NEUROLOGIC: No CVA, transient ischemic attack, seizures or dysarthria.   PSYCHIATRIC: No anxiety, depression or bipolar.  All other systems reviewed and negative.   PHYSICAL EXAMINATION: GENERAL: The patient is awake, alert, oriented x 3, not in acute distress.  VITAL SIGNS: She is afebrile, pulse is in the 110s, blood pressure is 130/73, pulse oximetry 100% on room air.  HEENT: Atraumatic, normocephalic. Pupils are equal, round and reactive to light and accommodation. EOM intact. Oral mucosa is moist.  NECK: Supple. No JVD. No carotid bruits.  RESPIRATORY: Clear to auscultation bilaterally. No rales, rhonchi, respiratory distress or labored breathing.  CARDIOVASCULAR: Both the heart sounds are normal. Rate tachycardic. No murmur heard. PMI not lateralized. Chest is nontender.  EXTREMITIES: Good pedal pulses, good femoral pulses. No lower extremity edema.  ABDOMEN: Soft, benign, nontender. No organomegaly. Positive bowel sounds.  NEUROLOGIC:  Grossly intact cranial nerves II through XII. No motor or sensory deficits.  PSYCHIATRIC: The patient is awake, alert, oriented x 3.  SKIN: Warm and dry.   LABORATORY, DIAGNOSTIC AND RADIOLOGIC DATA:  Glucose is 344, BUN is 15, creatinine 0.78, sodium 138, potassium 4.8, chloride 103, bicarbonate 13, calcium 8.4, anion gap is 15, blood glucose is 372. PH is 7.23, pCO2 at 34.   Wet prep shows clue cells. Hemoglobin and hematocrit is 12.3 and 36.7. White count is 13.7. LFTs within normal limits.   Pelvic ultrasound showed a cyst associated with the right ovary measuring 2.1 cm. Ovaries exhibit abnormal vascularity. Uterus is normal appearance.  Small amount of fluid in the right adnexa.   Urinalysis within normal limits.   ASSESSMENT AND PLAN: A 29 year old, Katie Griffin, with history of type 1 diabetes comes in with:  1.  Diabetic ketoacidosis in type 1 diabetes, setting. The patient has normal anion gap, but with  ketoacidosis and elevated sugars. We will admit the patient to stepdown Critical Care Unit, carbohydrate-controlled diet. IV insulin drip.  2.  We will hold off on the patient's insulin pump at present. Continue diabetic ketoacidosis protocol for insulin drip.  3.  Check metabolic panel in the morning. Continue IV fluids.  4.  Bacterial vaginosis treated with Flagyl for 7 days.  5.  Acidosis due to uncontrolled sugars.  We will continue monitoring labs. At this time, given insulin drip and IV fluids.  6.  Further work-up according to the patient's clinical course. Hospital admission plan was discussed with the patient.   TIME SPENT: 50 minutes    ____________________________ Jearl Klinefelter A. Allena Katz, MD sap:cc D: 12/12/2012 21:36:30 ET T: 12/12/2012 22:39:27 ET JOB#: [id number]  cc: Katie Griffin A. Allena Katz, MD, <Dictator> Willow Ora MD ELECTRONICALLY SIGNED 12/19/2012 14:50

## 2014-11-13 NOTE — Discharge Summary (Signed)
PATIENT NAME:  Katie BradfordCRAWLEY, Toniesha Y MR#:  [id number] DATE OF BIRTH:  1986/01/16  DATE OF ADMISSION:  12/12/2012 DATE OF DISCHARGE:  12/14/2012  For a detailed note, please take a look at the history and physical done on admission by Dr. Enedina FinnerSona Patel.   DIAGNOSES AT DISCHARGE:  Is as follows:  1.  Acute diabetic ketoacidosis.  2.  Vaginosis.  3.  Sinus tachycardia.   DIET:  The patient is being discharged on an American Diabetic Association diet.   ACTIVITY:  As tolerated.   FOLLOW-UP:  [facility].   DISCHARGE MEDICATIONS:  Humalog insulin by her insulin pump.  Metronidazole 500 mg twice daily x 5 days.   CONSULTANTS DURING THE HOSPITAL COURSE:  Dr. Mariel KanskyKen Fath from cardiology.   PERTINENT STUDIES DONE DURING THE HOSPITAL COURSE:  Ultrasound of the pelvis done showing a cyst associated with the right ovary 2.1 cm in greatest dimension.  Uterus is normal.  A small amount of free fluid in the right adnexal region.   HOSPITAL COURSE:  This is a 29 year old female with medical problems as mentioned above, presented to the hospital with nausea and noted to be in acute diabetic ketoacidosis.  1.  Acute diabetic ketoacidosis.  This was likely secondary to medical noncompliance.  The patient apparently ran out of her insulin in her insulin pump and had not been dosing herself appropriately.  She presented with nausea and noted to be in acute diabetic ketoacidosis.  Her insulin pump was turned off.  She was placed on an insulin drip.  Serial metabolic profiles were obtained.  Her anion gap since then has resolved.  Her clinical symptoms have improved.  She was eventually transitioned over to long-acting insulin, Lantus and NovoLog with meals and her sugars have remained stable.  She has had no further episodes of nausea or vomiting, is tolerating by mouth well.  She was discharged on her Humalog insulin to be dosed by her insulin pump and follow up with endocrinology as an outpatient.  The patient  apparently used to see Dr. Tedd SiasSolum, but she has been fired from their practice as she had missed multiple appointments.  The case management here in the hospital have set her up for outpatient follow-up with [facility] who will call her for a follow-up appointment.  The patient also is being referred for diabetic lifestyle as an outpatient.  2.  Sinus tachycardia.  The patient had persistent tachycardia despite being aggressively hydrated with heart rates in the 120s to 130s.  This seems like a chronic problem for the patient.  I did obtain a cardiology consult.  The patient was seen by Dr. Lady GaryFath.  She has seen Dr. Darrold JunkerParaschos in the past.  As per them, no acute intervention at this time.  She can get an echocardiogram done as an outpatient.  She was ambulated and did not have any worsening SVTs or worsening of her tachycardia.  3.  Vaginosis.  The patient had developed an infection as her sugars were uncontrolled.  She was started on Flagyl.  I am discharging her on the by mouth Flagyl as mentioned.   CODE STATUS:  The patient is a FULL CODE.   Time spent on discharge is 40 minutes.    ____________________________ Rolly PancakeVivek J. Cherlynn KaiserSainani, MD vjs:ea D: 12/14/2012 15:13:01 ET T: 12/15/2012 03:00:49 ET JOB#: [id number]  cc: Rolly PancakeVivek J. Cherlynn KaiserSainani, MD, <Dictator> Houston SirenVIVEK J Trishna Cwik MD ELECTRONICALLY SIGNED 12/29/2012 20:24

## 2014-11-13 NOTE — Consult Note (Signed)
   General Aspect Pt is a 29 yo female with history of type 1 diabetes melitus, hypertension and history of tachycardia who was admitted wtih hyperglycemia and was noted to have tachycardia. She has been evaluated for tachycardia in the past per Dr. Darrold JunkerParaschos. In the office in 2/13, she had a resting heart rate of 90. EKG suggests sinus tachycardia. She appears to be euvolemic at present and denies pain or shortness of brerath. She is unaware of a rapid heart rate. Her glucose has improved and she is off of the insulin drip. She denies sensation of rapid heart rate at home and feels base line at present.   Physical Exam:  GEN no acute distress, obese   HEENT PERRL, hearing intact to voice   NECK supple   RESP normal resp effort  clear BS  no use of accessory muscles   CARD Tachycardic  Normal, S1, S2  No murmur   ABD denies tenderness  no liver/spleen enlargement  normal BS  no Adominal Mass   LYMPH negative neck, negative axillae   EXTR negative cyanosis/clubbing, negative edema   SKIN normal to palpation   NEURO cranial nerves intact, motor/sensory function intact   PSYCH A+O to time, place, person   Review of Systems:  Subjective/Chief Complaint no complaints at present   General: Fatigue  Weakness   Skin: No Complaints   ENT: No Complaints   Eyes: No Complaints   Neck: No Complaints   Respiratory: No Complaints   Cardiovascular: No Complaints   Gastrointestinal: No Complaints   Genitourinary: No Complaints   Vascular: No Complaints   Musculoskeletal: No Complaints   Neurologic: No Complaints   Hematologic: No Complaints   Endocrine: No Complaints   Psychiatric: No Complaints   Review of Systems: All other systems were reviewed and found to be negative   Medications/Allergies Reviewed Medications/Allergies reviewed   EKG:  EKG nml qrs   Interpretation sinus tachycardia    No Known Allergies:    Impression 29 yo with history of type one  diabetes admitted with hyperglycemia with dka symtpoms and now noted to have persistant tachcycardia despite correction of hypovolemia and hyperglycemia. She appears to have sinus tachycardia with intermitant pvcs. Has history of tachycardia in the past. She denies chest pain, shortness of breath or other indication as to the etiology of her sinus tachcyardia. Eledctrolytes are normal and she is not signficantly anemic.   Plan 1. No obvious supraventricular arrythmia 2. WOuld continue to treat her hyperglycemia. 3. Maintain euvolemic state as you are doing.  4. Would not add beta blockers or calcium channel blockers at present given stability and apparent sinus tach at this point 5 Increase ambulation and will follow for further arrythmia 6. Will follow with you.   Electronic Signatures: Dalia HeadingFath, Sarahbeth Cashin A (MD)  (Signed 23-May-14 13:45)  Authored: General Aspect/Present Illness, History and Physical Exam, Review of System, EKG , Allergies, Impression/Plan   Last Updated: 23-May-14 13:45 by Dalia HeadingFath, Roshawna Colclasure A (MD)

## 2014-11-14 NOTE — Consult Note (Signed)
PATIENT NAME:  Katie Griffin, Katie Griffin MR#:  [id number] DATE OF BIRTH:  04-19-1986  DATE OF CONSULTATION:  09/12/2013  REQUESTING PHYSICIAN:  Enedina FinnerSona Patel, MD CONSULTING PHYSICIAN:  A. Wendall MolaMelissa Rahul Malinak, MD  CHIEF COMPLAINT: Hypoglycemia.   HISTORY OF PRESENT ILLNESS: This is a 29 year old female with type 1 diabetes who was admitted yesterday mid day after an episode of hypoglycemia with altered mental status. The patient was feeling weak. She had a blood sugar of 24; however, blood sugar remained persistently low after treatment. She was brought to the ER. The patient is an employee at [employer]. In the ER, again after treatment, blood sugar dropped into the 20s and she was admitted with use of IV dextrose.   The patient has a 15-year history of type 1 diabetes. She has no known complications from her diabetes. She had been using an insulin pump until last summer. She states the insulin pump was stopped due to the expense.   She [facility] with [facility] and has a next follow-up appointment in about 6 weeks. Her outpatient diabetes regimen includes Lantus 5 units each morning and Humalog based on insulin-to-carbohydrate ratio of 1:4 in addition to correction insulin. The patient cannot recall how she calculates her Humalog dose exactly. She does recall that yesterday her fasting sugar was 123 and she took 5 units of Humalog at breakfast. At lunch time, her blood sugar was 97 and she took 7 units of Humalog for what she recalls with a meal containing 30 grams of carbohydrates. Blood sugar dropped a few hours later.   She denies other recent episodes of severe hypoglycemia. She checks her blood sugars about 4 times per day. She recalls occasional sugars in the 60s but no severe lows requiring outside assistance. Her current hemoglobin A1c is 6.8% and diabetes is controlled.   PAST MEDICAL HISTORY: Type 1 diabetes.   PAST SURGICAL HISTORY: Appendectomy.   SOCIAL HISTORY: The patient is single. She  lives with her family. No alcohol or tobacco use. 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   FAMILY HISTORY: Diabetes and heart disease.   MEDICATIONS:  1.  Lantus 5 units q.a.m.  2.  Humalog as per HPI.   ALLERGIES: No known drug allergies.   REVIEW OF SYSTEMS:  HEENT: No blurred vision. No sore throat.  NECK: No neck pain. No dysphasia.  CARDIAC: No chest pain or palpitation.  PULMONARY: No cough. No shortness of breath.  ABDOMEN: No abdominal pain. No nausea.  EXTREMITIES: No leg swelling.  SKIN: No rash or recent skin changes. No pruritus.  ENDOCRINE: Denies heat or cold intolerance.  HEMATOLOGIC: Denies easy bruisability or recent bleeding.   PHYSICAL EXAMINATION:  VITAL SIGNS: Height 60.9 inches, weight 170 pounds, BMI 32, temperature 97.9, pulse 101, respirations 14, blood pressure 129/73.  GENERAL: A well-developed, well-nourished African American young female in no acute distress.  HEENT: EOMI. Oropharynx is clear. Mucous membranes moist.  NECK: Supple. No thyromegaly.  LYMPH: No submandibular lymphadenopathy.  CARDIAC: Regular rate and rhythm without murmur.  PULMONARY: Clear to auscultation bilaterally. No wheeze.  ABDOMEN: Diffusely soft, nontender.  EXTREMITIES: No peripheral edema is present.  SKIN: No rash or recent skin changes.  PSYCHIATRIC: Calm and cooperative.  NEUROLOGIC: Alert and oriented. No focal deficits. No tremors present.   LABORATORY DATA: Glucose 150, BUN 7, creatinine 0.6, sodium 135, potassium 3.7, CO2 26, calcium 8.7. Hemoglobin A1c 6.8%. CBC 9.7, hematocrit 33.5, platelets 361. Blood cultures negative x2.  GENERAL: A 29 year old female with a 15-year history of type 1 diabetes without complications who was admitted after an episode of hypoglycemia. I attribute hypoglycemia to unintentional excessive Humalog insulin dosing.  RECOMMENDATIONS:  1.  Continue her current Lantus insulin. Recommend she follow her Humalog instructions as  given by her prior endocrinologist. However, if her premeal sugar is under 100, she could consider taking half her standard dose. This should help prevent recurrent hypoglycemia.  2.  Recommend she follow up with her endocrinologist within the next 2 weeks.   Thank you for the kind request for consultation.   ____________________________ A. Wendall Mola, MD ams:np D: 09/12/2013 18:19:49 ET T: 09/12/2013 20:15:50 ET JOB#: [id number]  cc: A. Wendall Mola, MD, <Dictator> Macy Mis MD ELECTRONICALLY SIGNED 09/13/2013 8:49

## 2014-11-14 NOTE — Discharge Summary (Signed)
PATIENT NAME:  Katie Griffin, Katie Griffin MR#:  [id number] DATE OF BIRTH:  [birth year]  DATE OF ADMISSION:  09/11/2013 DATE OF DISCHARGE:  09/12/2013  PRESENTING COMPLAINT: Hypoglycemia.   DISCHARGE DIAGNOSES: 1.  Hypoglycemia in the setting of type 1 diabetes, improved, resolved.  2.  Type 1 diabetes.   CODE STATUS: Full code.   MEDICATIONS: 1.  Humalog, use sliding scale as before.  2.  Lantus 5 units once a day.   CONSULTATIONS:  Endocrine consultation with Dr. Tedd SiasSolum.   DIET:  Carbohydrate-controlled.   ACTIVITY LIMITATIONS: None.   FOLLOWUP: With your [facility] in [facility]   LABORATORY, DIAGNOSTIC AND RADIOLOGICAL DATA:  Glucose at discharge was 103. Blood cultures negative in 48 hours. CBC within normal limits. Basic metabolic panel within normal limits except sodium of 135. The hemoglobin A1c is 6.8. Urinalysis negative for UTI.    BRIEF SUMMARY OF HOSPITAL COURSE: [name] is a 29 year old African American female with history of type 1 diabetes who was recently taken off her insulin pump about a month ago by [facility]. She came into the Emergency Room for confusion and was found to have her blood glucose down in the 30s. She was admitted with:  1.  Altered mental status/encephalopathy secondary to hypoglycemia. She received D10 and sugars started rising up, nearing 300. Her D10 drip was discontinued. She was seen by Dr. Tedd SiasSolum and started back on her home regimen of Lantus 5 units and her Humalog sliding scale. The patient's sugars remained stable. She did not have any source of infection and her empiric antibiotics were discontinued. She [facility] in 1 to 2 weeks.   2.  Type 1 diabetes as per above.   Hospital stay otherwise remained stable. CODE STATUS: The patient remained a full code.   TIME SPENT: 40 minutes.  ____________________________ Wylie HailSona A. Allena KatzPatel, MD sap:cs D: 09/14/2013 07:32:34 ET T: 09/14/2013 15:10:58  ET JOB#: [id number]  cc: Tyquan Carmickle A. Allena KatzPatel, MD, <Dictator> Willow OraSONA A Patrice Matthew MD ELECTRONICALLY SIGNED 09/21/2013 13:38

## 2014-11-14 NOTE — Consult Note (Signed)
Chief Complaint and History:  Referring Physician Dr. Allena KatzPatel   Chief Complaint Hypoglycemia   Allergies:  No Known Allergies:   Assessment/Plan:  Assessment/Plan 29 yo F with typ 1 diabetes admitted last evening with altered mental status and hypoglycemia. She was initially requiring IV dextrose, however that has been stopped. Sugars now in the 160s. She is tol her diet. Hx was reviewed. She took 7 units of Humalog yesterday before lunch (at 11:30am) when her sugar was 93. She estimates she ate 30 g carbs only and is follow a 1:4 carb counting ratio.  A/ Hypoglycemia - likely due to excessive Humalog  P/ Continue out-pt dose of Lantus 5 units qam Continue Humalo as per out-pt instructions on carb counting and correction, however if BG pre-meal is less than 00 she should take 50% of typical dose. She expressed understanding. Encouraged F[facility]U [facility] in next 2 weeks.  Full dictated consult to follow.   Electronic Signatures: Raj JanusSolum, Anna M (MD)  (Signed [phone]-Feb-15 11:22)  Authored: Chief Complaint and History, ALLERGIES, Assessment/Plan   Last Updated: 20-Feb-15 11:22 by Raj JanusSolum, Anna M (MD)

## 2014-11-14 NOTE — H&P (Signed)
PATIENT NAME:  Katie BradfordCRAWLEY, Katie Griffin MR#:  [id number] DATE OF BIRTH:  05-22-1986  DATE OF ADMISSION:  09/11/2013  [facility]  [facility]   ENDOCRINOLOGIST:  [facility]arr [facility]   REFERRING PHYSICIAN:  Dr. Scotty CourtStafford.   CHIEF COMPLAINT:  Hypoglycemia.   HISTORY OF PRESENT ILLNESS:  The patient is a 29 year old African American female with a past medical history of insulin-dependent diabetes mellitus, used to be on insulin pump, but not anymore, currently taking Lantus insulin 5 units subQ q.a.m., works at dining services at [employer], is sent over to the ER for persistent hypoglycemia.  The patient is reporting that in the morning she took Lantus 5 units as usual and at around 11:00 a.m. while at work she took Humalog approximately 8 units.  Following that, at around 6:00 p.m., the patient started feeling weak and she was found to be confused.  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  The patient's Accu-Chek was found to be at 24.  The patient was given juice and crackers and it went up to 53.  Subsequently whenever after she checked her Accu-Chek again which was at 43.  As her Accu-Chek was still low, the patient was brought into the ER.  The patient denies any fever or chills.  Denies any burning micturition or cough.  Chest x-ray in the ER was negative.  White count is elevated.  The patient denies any recent changes in her medications or Lantus dosing.  She does not think she had overdosed her insulin by accident.  In the ER, the patient was given D50 one amp, following which her blood sugar went up to 116.  Subsequently, in 2 hours it dropped down to 26.  The patient is started on D5 drip at 100 mL per hour and hospitalist team is called to admit the patient.  With D5 at 100 per hour, Accu-Chek went up to 97, however subsequently in an hour it dropped down to 35.  The fluids were changed to D10 and we will run the D10 drip at 100  mL per hour.  During my examination, the patient had a flat affect, but answering questions appropriately, but sluggishly.  Denies any chest pain or shortness of breath and no other complaints.  Her twin brother is at bedside.   PAST MEDICAL HISTORY:  Insulin-dependent diabetes mellitus, on Lantus.   PAST SURGICAL HISTORY:  None.   ALLERGIES:  No known drug allergies.   PSYCHOSOCIAL HISTORY:  Lives at home with parents.  Denies any smoking, alcohol or illicit drug usage.   FAMILY HISTORY:  Parents have history of hypertension.   HOME MEDICATIONS:  Lantus 5 units subQ q.a.m. and she uses Humalog insulin sliding scale.    REVIEW OF SYSTEMS: CONSTITUTIONAL:  Denies any fever.  Complaining of fatigue and weakness while stressed out at work.  EYES:  Denies blurry vision, double vision.  EARS, NOSE, THROAT:  Denies epistaxis or discharge.  RESPIRTAORY:  Denies cough, COPD.  CARDIOVASCULAR:  No chest pain, palpitations.  GASTROINTESTINAL:  Denies nausea, vomiting, diarrhea, abdominal pain, hematemesis.  GENITOURINARY:  No dysuria, hematuria.  GYNECOLOGIC AND BREAST:  Denies breast mass or vaginal discharge.  ENDOCRINE:  Denies polyuria, nocturia, thyroid problems.  Has insulin-dependent diabetes mellitus.  HEMATOLOGIC AND LYMPHATIC:  No anemia, easy bruising, bleeding.  INTEGUMENT:  No acne, rash, lesions.  MUSCULOSKELETAL:  No joint pain in the neck and back.  Denies any gout.  NEUROLOGIC:  Denies vertigo, ataxia PSYCHIATRIC:  No ADD, OCD.   PHYSICAL EXAMINATION: VITAL SIGNS:  Temperature 96.6, pulse 98, respirations 18, blood pressure 144/80, pulse ox 99%.  GENERAL APPEARANCE:  Not under acute distress.  Moderately built and nourished.  Has flat affect.  HEENT:  Normocephalic, atraumatic.  Pupils are equally reacting to light and accommodation.  No scleral icterus.  No conjunctival injection.  No sinus tenderness.  Moist mucous membranes.  NECK:  Supple.  No JVD.  No thyromegaly.  Range  of motion is intact.  LUNGS:  Clear to auscultation bilaterally.  No accessory muscle usage.  No anterior chest wall tenderness on palpation.  CARDIAC:  S1, S2 normal.  Regular rate and rhythm.  No murmurs.  GASTROINTESTINAL:  Soft.  Bowel sounds are positive in all four quadrants.  Nontender, nondistended.  No hepatosplenomegaly.  No masses felt. NEUROLOGIC:  Awake, alert, oriented x 3, answering questions appropriately and accurately, but sluggishly responding, following verbal commands.  Cranial nerves II through XII are intact.  Motor and sensory are intact.  Reflexes are 2+.  EXTREMITIES:  No edema.  No cyanosis.  No clubbing.  SKIN:  Warm to touch.  Normal turgor.  No rashes.  No lesions.  No diaphoresis.  MUSCULOSKELETAL:  No joint effusion, tenderness, erythema.  PSYCHIATRIC:  Normal mood, but flat affect.   LABORATORIES AND IMAGING STUDIES:  Accu-Cheks 116, after giving D50, subsequently it went down to 26 and 24 and at 23 and at 23 hour 55 minutes it was at 34.  Glucose 124, BUN 12, creatinine 0.64, sodium 134, potassium 4.6, chloride 100, CO2 29.  GFR greater than 60.  Anion gap 5, serum osmolality 269, calcium 8.6.  WBC is elevated at 13.2, hemoglobin 11.6, hematocrit 35.5, platelets 405.  LFTs:  Total protein is 7.2, albumin 3.2, bili total 0.2, alkaline phosphatase 121, AST 19, ALT 24.  Chest x-ray, no acute findings.   ASSESSMENT AND PLAN:  A 29 year old [place] American female who works at dining services at [employer] is sent over to the ER for low Accu-Cheks and for altered mental status.  1.  Altered mental status secondary to hypoglycemia.  Etiology is unclear.  Could be from accidental insulin overdose versus underlying sepsis.  We will admit her to Critical Care Unit stepdown.  We will continue D10 at 100 mL per hour.  Accu-Cheks q. hour.  Blood cultures are ordered.  Urinalysis is still pending.  Chest x-ray is negative.  We will start her on empiric antibiotic  IV Rocephin for possible urinary tract infection coverage.  Endocrinology consult is placed to Dr. Tedd Sias.  We will check serum C-peptide and sulfonylurea levels.  2.  Insulin-dependent diabetes mellitus.  We will hold Lantus and Humalog for now.  3.  We will provide her gastrointestinal prophylaxis and deep vein thrombosis prophylaxis.    Plan of care discussed with the patient and her brother at bedside.   Total critical care time spent is 45 minutes.     ____________________________ Ramonita Lab, MD ag:ea D: 09/12/2013 00:36:32 ET T: 09/12/2013 01:09:36 ET JOB#: [id number]  cc: Ramonita Lab, MD, <Dictator> Ramonita Lab MD ELECTRONICALLY SIGNED 09/18/2013 18:49

## 2014-11-16 ENCOUNTER — Emergency Department
Admit: 2014-11-16 | Disposition: A | Discharge status: Home / Self Care | Payer: Self-pay | Admitting: Emergency Medicine

## 2014-11-16 LAB — COMPREHENSIVE METABOLIC PANEL
ALBUMIN: 3.1 g/dL — AB
ALK PHOS: 71 U/L
ALT: 12 U/L — AB
Anion Gap: 5 — ABNORMAL LOW (ref 7–16)
BUN: 7 mg/dL
Bilirubin,Total: 0.3 mg/dL
CALCIUM: 8.7 mg/dL — AB
CHLORIDE: 108 mmol/L
CO2: 22 mmol/L
Creatinine: 0.62 mg/dL
EGFR (African American): >60
EGFR (Non-African Amer.): >60
GLUCOSE: 102 mg/dL — AB
Potassium: 4 mmol/L
SGOT(AST): 14 U/L — ABNORMAL LOW
SODIUM: 135 mmol/L
Total Protein: 6.5 g/dL

## 2014-11-16 LAB — CBC WITH DIFFERENTIAL/PLATELET
BASOS ABS: 0 10*3/uL (ref 0.0–0.1)
Basophil %: 0.3 %
Eosinophil #: 0.1 10*3/uL (ref 0.0–0.7)
Eosinophil %: 1.2 %
HCT: 32.1 % — ABNORMAL LOW (ref 35.0–47.0)
HGB: 10.9 g/dL — AB (ref 12.0–16.0)
LYMPHS PCT: 16.4 %
Lymphocyte #: 1.8 10*3/uL (ref 1.0–3.6)
MCH: 31.8 pg (ref 26.0–34.0)
MCHC: 33.9 g/dL (ref 32.0–36.0)
MCV: 94 fL (ref 80–100)
MONOS PCT: 7.2 %
Monocyte #: 0.8 x10 3/mm (ref 0.2–0.9)
NEUTROS PCT: 74.9 %
Neutrophil #: 8.4 10*3/uL — ABNORMAL HIGH (ref 1.4–6.5)
Platelet: 354 10*3/uL (ref 150–440)
RBC: 3.42 10*6/uL — AB (ref 3.80–5.20)
RDW: 12.9 % (ref 11.5–14.5)
WBC: 11.2 10*3/uL — ABNORMAL HIGH (ref 3.6–11.0)

## 2014-11-16 LAB — URINALYSIS, COMPLETE
Bilirubin,UR: NEGATIVE
Blood: NEGATIVE
GLUCOSE, UR: NEGATIVE mg/dL (ref 0–75)
KETONE: NEGATIVE
Leukocyte Esterase: NEGATIVE
Nitrite: NEGATIVE
Ph: 6 (ref 4.5–8.0)
Protein: NEGATIVE
RBC,UR: NONE SEEN /HPF (ref 0–5)
Specific Gravity: 1.021 (ref 1.003–1.030)

## 2014-11-16 LAB — HCG, QUANTITATIVE, PREGNANCY: Beta Hcg, Quant.: 38630 m[IU]/mL — ABNORMAL HIGH

## 2015-02-14 ENCOUNTER — Emergency Department
Admission: EM | Admit: 2015-02-14 | Discharge: 2015-02-14 | Disposition: A | Discharge status: Home / Self Care | Payer: 59 | Attending: Emergency Medicine | Admitting: Emergency Medicine

## 2015-02-14 DIAGNOSIS — B354 Tinea corporis: Secondary | ICD-10-CM | POA: Diagnosis not present

## 2015-02-14 DIAGNOSIS — L299 Pruritus, unspecified: Secondary | ICD-10-CM | POA: Diagnosis present

## 2015-02-14 MED ORDER — CLOTRIMAZOLE POWD: 1.0000 "application " | Freq: Three times a day (TID) | Status: DC

## 2015-02-14 MED ORDER — CLOTRIMAZOLE 1 % EX CREA: 1.0000 "application " | TOPICAL_CREAM | Freq: Every evening | CUTANEOUS | Status: DC | PRN

## 2015-02-14 MED ORDER — HYDROXYZINE HCL 50 MG PO TABS: 50.0000 mg | ORAL_TABLET | Freq: Three times a day (TID) | ORAL | Status: DC | PRN

## 2015-02-14 NOTE — ED Notes (Signed)
Patient to ED with c/o skin irritation to inner thighs being worsened by heat.

## 2015-02-14 NOTE — Discharge Instructions (Signed)

## 2015-02-14 NOTE — ED Provider Notes (Signed)
[facility] Emergency Department Provider Note ____________________________________________  Time seen: Approximately 3:41 PM  I have reviewed the triage vital signs and the nursing notes.   HISTORY  Chief Complaint Rash    HPI Katie Griffin is a 29 y.o. female patient complaining a rash between the inner thighs about 2 weeks. She states the rash seems gets worse with heat and sweating. Patient stated this intense itching. Patient denies any fever or signs and symptoms secondary infection. Patient denies any pain with this complaint. No palliative measures taken for this complaint.  No past medical history on file.  There are no active problems to display for this patient.   No past surgical history on file.  Current Outpatient Rx  Name  Route  Sig  Dispense  Refill  . clotrimazole (CLOTRIMAZOLE ANTI-FUNGAL) 1 % cream   Topical   Apply 1 application topically at bedtime as needed.   30 g   0   . Clotrimazole POWD   Topical   Apply 1 application topically 3 (three) times daily.   25 g   1     Allergies Review of patient's allergies indicates not on file.  No family history on file.  Social History History  Substance Use Topics  . Smoking status: Not on file  . Smokeless tobacco: Not on file  . Alcohol Use: Not on file    Review of Systems Constitutional: No fever/chills Eyes: No visual changes. ENT: No sore throat. Cardiovascular: Denies chest pain. Respiratory: Denies shortness of breath. Gastrointestinal: No abdominal pain.  No nausea, no vomiting.  No diarrhea.  No constipation. Genitourinary: Negative for dysuria. Musculoskeletal: Negative for back pain. Skin: Positive for rash bilateral medial thigh area Neurological: Negative for headaches, focal weakness or numbness. 10-point ROS otherwise negative.  ____________________________________________   PHYSICAL EXAM:  VITAL SIGNS: ED Triage Vitals  Enc Vitals Group    BP 02/14/15 1427 143/81 mmHg     Pulse Rate 02/14/15 1427 103     Resp 02/14/15 1427 18     Temp 02/14/15 1427 98.2 F (36.8 C)     Temp Source 02/14/15 1427 Oral     SpO2 02/14/15 1427 97 %     Weight 02/14/15 1427 202 lb (91.627 kg)     Height 02/14/15 1427 5\' 2"  (1.575 m)     Head Cir --      Peak Flow --      Pain Score --      Pain Loc --      Pain Edu? --      Excl. in GC? --    Constitutional: Alert and oriented. Well appearing and in no acute distress. Eyes: Conjunctivae are normal. PERRL. EOMI. Head: Atraumatic. Nose: No congestion/rhinnorhea. Mouth/Throat: Mucous membranes are moist.  Oropharynx non-erythematous. Neck: No stridor. No cervical spine tenderness to palpation. Hematological/Lymphatic/Immunilogical: No cervical lymphadenopathy. Cardiovascular: Normal rate, regular rhythm. Grossly normal heart sounds.  Good peripheral circulation. Respiratory: Normal respiratory effort.  No retractions. Lungs CTAB. Gastrointestinal: Soft and nontender. No distention. No abdominal bruits. No CVA tenderness. Musculoskeletal: No lower extremity tenderness nor edema.  No joint effusions. Neurologic:  Normal speech and language. No gross focal neurologic deficits are appreciated. No gait instability. Skin:  Skin is warm, dry and intact. Erythematous macular lesions medial thighs bilaterally.  Psychiatric: Mood and affect are normal. Speech and behavior are normal.  ____________________________________________   LABS (all labs ordered are listed, but only abnormal results are displayed)  Labs Reviewed -  No data to display ____________________________________________  EKG   ____________________________________________  RADIOLOGY   ____________________________________________   PROCEDURES  Procedure(s) performed: None  Critical Care performed: No  ____________________________________________   INITIAL IMPRESSION / ASSESSMENT AND PLAN / ED COURSE  Pertinent  labs & imaging results that were available during my care of the patient were reviewed by me and considered in my medical decision making (see chart for details).  Tinea corporis.  ____________________________________________   FINAL CLINICAL IMPRESSION(S) / ED DIAGNOSES  Final diagnoses:  Tinea corporis      Joni Reining, PA-C 02/14/15 1558  Emily Filbert, MD 02/14/15 [phone]

## 2015-02-14 NOTE — ED Notes (Signed)
AaoX3.  SKIN WARM AND DRY.  NAD.  D/C HOME

## 2015-03-24 ENCOUNTER — Emergency Department: Payer: 59

## 2015-03-24 ENCOUNTER — Emergency Department
Admission: EM | Admit: 2015-03-24 | Discharge: 2015-03-24 | Disposition: A | Discharge status: Home / Self Care | Payer: 59 | Attending: Emergency Medicine | Admitting: Emergency Medicine

## 2015-03-24 ENCOUNTER — Encounter: Payer: Self-pay | Admitting: Emergency Medicine

## 2015-03-24 DIAGNOSIS — M79661 Pain in right lower leg: Secondary | ICD-10-CM | POA: Diagnosis not present

## 2015-03-24 DIAGNOSIS — Z79899 Other long term (current) drug therapy: Secondary | ICD-10-CM | POA: Diagnosis not present

## 2015-03-24 DIAGNOSIS — O9989 Other specified diseases and conditions complicating pregnancy, childbirth and the puerperium: Secondary | ICD-10-CM | POA: Insufficient documentation

## 2015-03-24 DIAGNOSIS — R2241 Localized swelling, mass and lump, right lower limb: Secondary | ICD-10-CM | POA: Diagnosis not present

## 2015-03-24 DIAGNOSIS — O24113 Pre-existing diabetes mellitus, type 2, in pregnancy, third trimester: Secondary | ICD-10-CM | POA: Insufficient documentation

## 2015-03-24 DIAGNOSIS — Z3A32 32 weeks gestation of pregnancy: Secondary | ICD-10-CM | POA: Insufficient documentation

## 2015-03-24 HISTORY — DX: Type 2 diabetes mellitus without complications: E11.9

## 2015-03-24 LAB — URINALYSIS COMPLETE WITH MICROSCOPIC (ARMC ONLY)
BILIRUBIN URINE: NEGATIVE
GLUCOSE, UA: NEGATIVE mg/dL
Hgb urine dipstick: NEGATIVE
KETONES UR: NEGATIVE mg/dL
Nitrite: NEGATIVE
PH: 6 (ref 5.0–8.0)
Protein, ur: NEGATIVE mg/dL
Specific Gravity, Urine: 1.019 (ref 1.005–1.030)

## 2015-03-24 NOTE — ED Notes (Addendum)
Pt having right lower left leg pain that started on Monday.  Pt reported to be [redacted] weeks pregnant.

## 2015-03-24 NOTE — ED Provider Notes (Signed)
[facility] Emergency Department Provider Note ____________________________________________  Time seen: 1510  I have reviewed the triage vital signs and the nursing notes.  HISTORY  Chief Complaint  Leg Pain  HPI Katie Griffin is a 29 y.o. female reports to the ED with complaints of right calf pain with onset today. She denies any injury, trauma, or accident. She is 43 weeks' gestational age, without any history of preeclampsia, leg swelling, or other symptoms. She reports onset of posterior calf pain is aggravated by prolonged standing and walking. Denies any distal paresthesias, skin temperature or color changes, or significant swelling. She is also without any shortness of breath, chest pain or cough. She was to pain at a 10/10 when it is present. Pain is resolved completely with sitting and/or elevated the leg.  Past Medical History  Diagnosis Date  . Diabetes mellitus without complication    There are no active problems to display for this patient.  History reviewed. No pertinent past surgical history.  Current Outpatient Rx  Name  Route  Sig  Dispense  Refill  . clotrimazole (CLOTRIMAZOLE ANTI-FUNGAL) 1 % cream   Topical   Apply 1 application topically at bedtime as needed.   30 g   0   . Clotrimazole POWD   Topical   Apply 1 application topically 3 (three) times daily.   25 g   1   . hydrOXYzine (ATARAX/VISTARIL) 50 MG tablet   Oral   Take 1 tablet (50 mg total) by mouth 3 (three) times daily as needed.   30 tablet   0    Allergies Review of patient's allergies indicates no known allergies.  No family history on file.  Social History Social History  Substance Use Topics  . Smoking status: Never Smoker   . Smokeless tobacco: None  . Alcohol Use: None   Review of Systems  Constitutional: Negative for fever. Eyes: Negative for visual changes. ENT: Negative for sore throat. Cardiovascular: Negative for chest pain. Respiratory:  Negative for shortness of breath. Gastrointestinal: Negative for abdominal pain, vomiting and diarrhea. Genitourinary: Negative for dysuria. Musculoskeletal: Negative for back pain. Right leg pain as above Skin: Negative for rash. Neurological: Negative for headaches, focal weakness or numbness. ____________________________________________  PHYSICAL EXAM:  VITAL SIGNS: ED Triage Vitals  Enc Vitals Group     BP 03/24/15 1431 129/77 mmHg     Pulse Rate 03/24/15 1431 99     Resp 03/24/15 1431 18     Temp 03/24/15 1430 97.9 F (36.6 C)     Temp Source 03/24/15 1430 Oral     SpO2 03/24/15 1431 98 %     Weight --      Height --      Head Cir --      Peak Flow --      Pain Score 03/24/15 1431 9     Pain Loc --      Pain Edu? --      Excl. in GC? --    Constitutional: Alert and oriented. Well appearing and in no distress. Eyes: Conjunctivae are normal. PERRL. Normal extraocular movements. ENT   Head: Normocephalic and atraumatic.   Nose: No congestion/rhinnorhea.   Mouth/Throat: Mucous membranes are moist.   Neck: Supple. No thyromegaly. Hematological/Lymphatic/Immunilogical: No cervical lymphadenopathy. Cardiovascular: Normal rate, regular rhythm. Normal distal pulses Respiratory: Normal respiratory effort. No wheezes/rales/rhonchi. Gastrointestinal: Soft and nontender. No distention. Musculoskeletal: Right LE without distal edema, warmth, or tenderness. Right calf with minimal tenderness, no  palpable cords, or muscle defect. Normal ankle exam. Nontender with normal range of motion in all extremities.  Neurologic:  Normal gait without ataxia. Normal speech and language. No gross focal neurologic deficits are appreciated. Skin:  Skin is warm, dry and intact. No rash noted. Psychiatric: Mood and affect are normal. Patient exhibits appropriate insight and judgment. ____________________________________________    LABS (pertinent positives/negatives) Labs Reviewed   URINALYSIS COMPLETEWITH MICROSCOPIC ([facility] ONLY) - Abnormal; Notable for the following:    Color, Urine YELLOW (*)    APPearance HAZY (*)    Leukocytes, UA 3+ (*)    Bacteria, UA RARE (*)    Squamous Epithelial / LPF 6-30 (*)    All other components within normal limits  _______________________________   RADIOLOGY  Ultrasound Doppler RLE IMPRESSION: No evidence of deep venous thrombosis. ____________________________________________  INITIAL IMPRESSION / ASSESSMENT AND PLAN / ED COURSE  Right calf pain without evidence of DVT. Suggest muscle pain due to pregnancy state without neurovascular deficit. Take Tylenol as needed.  Follow-up with OB provider. Work note for primarily seated work provided.  ____________________________________________  FINAL CLINICAL IMPRESSION(S) / ED DIAGNOSES  Final diagnoses:  [redacted] weeks gestation of pregnancy  Calf pain, right     Lissa Hoard, PA-C 03/24/15 1801  Loleta Rose, MD 03/24/15 [phone]

## 2015-03-24 NOTE — Discharge Instructions (Signed)
Your exam and ultrasound were normal today. You should follow-up with your provider as scheduled.  Take Tylenol as needed for pain. Consider wearing your compression socks while working.

## 2015-08-23 DIAGNOSIS — Z30431 Encounter for routine checking of intrauterine contraceptive device: Secondary | ICD-10-CM | POA: Insufficient documentation

## 2015-10-15 ENCOUNTER — Emergency Department: Payer: PRIVATE HEALTH INSURANCE

## 2015-10-15 ENCOUNTER — Emergency Department
Admission: EM | Admit: 2015-10-15 | Discharge: 2015-10-15 | Disposition: A | Discharge status: Home / Self Care | Payer: PRIVATE HEALTH INSURANCE | Attending: Emergency Medicine | Admitting: Emergency Medicine

## 2015-10-15 ENCOUNTER — Encounter: Payer: Self-pay | Admitting: Emergency Medicine

## 2015-10-15 ENCOUNTER — Emergency Department: Admission: EM | Admit: 2015-10-15 | Discharge: 2015-10-15 | Discharge status: Against Medical Advice | Payer: 59

## 2015-10-15 DIAGNOSIS — S9032XA Contusion of left foot, initial encounter: Secondary | ICD-10-CM | POA: Insufficient documentation

## 2015-10-15 DIAGNOSIS — Y990 Civilian activity done for income or pay: Secondary | ICD-10-CM | POA: Insufficient documentation

## 2015-10-15 DIAGNOSIS — Y9289 Other specified places as the place of occurrence of the external cause: Secondary | ICD-10-CM | POA: Diagnosis not present

## 2015-10-15 DIAGNOSIS — E119 Type 2 diabetes mellitus without complications: Secondary | ICD-10-CM | POA: Diagnosis not present

## 2015-10-15 DIAGNOSIS — Z79899 Other long term (current) drug therapy: Secondary | ICD-10-CM | POA: Diagnosis not present

## 2015-10-15 DIAGNOSIS — W228XXA Striking against or struck by other objects, initial encounter: Secondary | ICD-10-CM | POA: Diagnosis not present

## 2015-10-15 DIAGNOSIS — Y9389 Activity, other specified: Secondary | ICD-10-CM | POA: Diagnosis not present

## 2015-10-15 DIAGNOSIS — S90122A Contusion of left lesser toe(s) without damage to nail, initial encounter: Secondary | ICD-10-CM

## 2015-10-15 DIAGNOSIS — S99922A Unspecified injury of left foot, initial encounter: Secondary | ICD-10-CM | POA: Diagnosis present

## 2015-10-15 MED ORDER — ACETAMINOPHEN 325 MG PO TABS
650.0000 mg | ORAL_TABLET | Freq: Once | ORAL | Status: AC
  Administered 2015-10-15: 650 mg via ORAL
  Filled 2015-10-15: qty 2

## 2015-10-15 NOTE — ED Notes (Signed)
States she had O2 tank fall onto foot at work today

## 2015-10-15 NOTE — ED Provider Notes (Signed)
[facility] Emergency Department Provider Note  ____________________________________________  Time seen: Approximately 4:22 PM  I have reviewed the triage vital signs and the nursing notes.   HISTORY  Chief Complaint Foot Pain    HPI Katie Griffin is a 30 y.o. female , NAD, reports the emergency department with left distal foot and toe pain after being injured at work. 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 Patient is uncertain of how the oxygen tank fell. She denies any changes in vision, chest pain, shortness of breath, fall. States the tank landed on her distal left foot and toes. Has had pain in the area since that time. Can ambulate but with increased pain with weightbearing. Denies any numbness, weakness, tingling. Has noted some bruising but no swelling. Denies any open wounds or lacerations. Denies any injury to this foot in the past.   Past Medical History  Diagnosis Date  . Diabetes mellitus without complication (HCC)     There are no active problems to display for this patient.   History reviewed. No pertinent past surgical history.  Current Outpatient Rx  Name  Route  Sig  Dispense  Refill  . clotrimazole (CLOTRIMAZOLE ANTI-FUNGAL) 1 % cream   Topical   Apply 1 application topically at bedtime as needed.   30 g   0   . Clotrimazole POWD   Topical   Apply 1 application topically 3 (three) times daily.   25 g   1   . hydrOXYzine (ATARAX/VISTARIL) 50 MG tablet   Oral   Take 1 tablet (50 mg total) by mouth 3 (three) times daily as needed.   30 tablet   0     Allergies Review of patient's allergies indicates no known allergies.  No family history on file.  Social History Social History  Substance Use Topics  . Smoking status: Never Smoker   . Smokeless tobacco: None  . Alcohol Use: No     Review of Systems  Constitutional: No fever/chills, fatigue.  Eyes: No visual  changes.  Cardiovascular: No chest pain. Respiratory:  No shortness of breath. No wheezing.  Musculoskeletal: Positive left foot and toe pain.  Skin: Positive left foot and toe bruising. Negative for rash, open wounds, lacerations. Neurological: Negative for headaches, focal weakness or numbness. No tingling. 10-point ROS otherwise negative.  ____________________________________________   PHYSICAL EXAM:  VITAL SIGNS: ED Triage Vitals  Enc Vitals Group     BP 10/15/15 1538 144/107 mmHg     Pulse Rate 10/15/15 1538 88     Resp 10/15/15 1538 18     Temp 10/15/15 1538 97.7 F (36.5 C)     Temp Source 10/15/15 1538 Oral     SpO2 10/15/15 1538 99 %     Weight 10/15/15 1538 205 lb (92.987 kg)     Height 10/15/15 1538 5\' 2"  (1.575 m)     Head Cir --      Peak Flow --      Pain Score 10/15/15 1538 9     Pain Loc --      Pain Edu? --      Excl. in GC? --     Constitutional: Alert and oriented. Well appearing and in no acute distress. Eyes: Conjunctivae are normal.  Head: Atraumatic. Cardiovascular:  Good peripheral circulation. Respiratory: Normal respiratory effort without tachypnea or retractions.. Musculoskeletal: Tenderness to palpation about the left great toe, proximal second and third  toes. No pain to palpation about the left foot or ankle. Full range of motion of left ankle without pain. Patient is able to move left toes but with some discomfort in the second and third toe with movement. No lower extremity tenderness nor edema.  No joint effusions. Neurologic:  Normal speech and language. No gross focal neurologic deficits are appreciated. Sensation of left lower extremity grossly intact. Skin:  Skin is warm, dry and intact. No rash noted. Mild darkening of skin noted to the distal first, second, third toes. Psychiatric: Mood and affect are normal. Speech and behavior are normal. Patient exhibits appropriate insight and  judgement.   ____________________________________________   LABS  None  ____________________________________________  EKG  None ____________________________________________  RADIOLOGY I have personally viewed and evaluated these images (plain radiographs) as part of my medical decision making, as well as reviewing the written report by the radiologist.  Dg Foot Complete Left  10/15/2015  CLINICAL DATA:  PATIENT HAD AN OXYGEN TANK FALL ON HER FOOT TODAY. MOST OF HER PAIN IS BELOW HER TOES. EXAM: LEFT FOOT - COMPLETE 3+ VIEW COMPARISON:  None FINDINGS: No fracture or dislocation of mid foot or forefoot. The phalanges are normal. The calcaneus is normal. No soft tissue abnormality. IMPRESSION: No fracture or dislocation. Electronically Signed   By: Genevive Bi M.D.   On: 10/15/2015 16:59    ____________________________________________    PROCEDURES  Procedure(s) performed: None    Medications  acetaminophen (TYLENOL) tablet 650 mg (650 mg Oral Given 10/15/15 1648)     ____________________________________________   INITIAL IMPRESSION / ASSESSMENT AND PLAN / ED COURSE  Pertinent imaging results that were available during my care of the patient were reviewed by me and considered in my medical decision making (see chart for details).  Patient's diagnosis is consistent with contusion of left foot including toes. Patient will be discharged home with instructions to alternate over-the-counter Tylenol and ibuprofen as needed for pain. Patient to apply ice to the affected area 20 minutes 3-4 times daily as needed. Advised that the foot be elevated when not ambulating. Patient is instructed to wear hard soled shoe as given in the emergency department over the next 2-3 days. She is to follow up with an outpatient urgent care or medical facility as deemed appropriate by her Workmen's Comp. insurance within 3 days. Patient is given ED precautions to return to the ED for any  worsening or new symptoms.    ____________________________________________  FINAL CLINICAL IMPRESSION(S) / ED DIAGNOSES  Final diagnoses:  Contusion of left foot including toes, initial encounter      NEW MEDICATIONS STARTED DURING THIS VISIT:  New Prescriptions   No medications on file         Hope Pigeon, PA-C 10/15/15 1706  Emily Filbert, MD 10/15/15 [phone]

## 2015-10-15 NOTE — ED Notes (Signed)
1545-- ice pack applied to injured foot.

## 2015-10-15 NOTE — Discharge Instructions (Signed)
Foot Contusion A foot contusion is a deep bruise to the foot. Contusions are the result of an injury that caused bleeding under the skin. The contusion may turn blue, purple, or yellow. Minor injuries will give you a painless contusion, but more severe contusions may stay painful and swollen for a few weeks. CAUSES  A foot contusion comes from a direct blow to that area, such as a heavy object falling on the foot. SYMPTOMS   Swelling of the foot.  Discoloration of the foot.  Tenderness or soreness of the foot. DIAGNOSIS  You will have a physical exam and will be asked about your history. You may need an X-ray of your foot to look for a broken bone (fracture).  TREATMENT  An elastic wrap may be recommended to support your foot. Resting, elevating, and applying cold compresses to your foot are often the best treatments for a foot contusion. Over-the-counter medicines may also be recommended for pain control. HOME CARE INSTRUCTIONS   Put ice on the injured area.  Put ice in a plastic bag.  Place a towel between your skin and the bag.  Leave the ice on for 15-20 minutes, 03-04 times a day.  Only take over-the-counter or prescription medicines for pain, discomfort, or fever as directed by your caregiver.  If told, use an elastic wrap as directed. This can help reduce swelling. You may remove the wrap for sleeping, showering, and bathing. If your toes become numb, cold, or blue, take the wrap off and reapply it more loosely.  Elevate your foot with pillows to reduce swelling.  Try to avoid standing or walking while the foot is painful. Do not resume use until instructed by your caregiver. Then, begin use gradually. If pain develops, decrease use. Gradually increase activities that do not cause discomfort until you have normal use of your foot.  See your caregiver as directed. It is very important to keep all follow-up appointments in order to avoid any lasting problems with your foot,  including long-term (chronic) pain. SEEK IMMEDIATE MEDICAL CARE IF:   You have increased redness, swelling, or pain in your foot.  Your swelling or pain is not relieved with medicines.  You have loss of feeling in your foot or are unable to move your toes.  Your foot turns cold or blue.  You have pain when you move your toes.  Your foot becomes warm to the touch.  Your contusion does not improve in 2 days. MAKE SURE YOU:   Understand these instructions.  Will watch your condition.  Will get help right away if you are not doing well or get worse.   This information is not intended to replace advice given to you by your health care provider. Make sure you discuss any questions you have with your health care provider.   Document Released: 05/01/2006 Document Revised: 01/09/2012 Document Reviewed: 03/16/2015 Elsevier Interactive Patient Education 2016 [employer]  Cryotherapy Cryotherapy is when you put ice on your injury. Ice helps lessen pain and puffiness (swelling) after an injury. Ice works the best when you start using it in the first 24 to 48 hours after an injury. HOME CARE  Put a dry or damp towel between the ice pack and your skin.  You may press gently on the ice pack.  Leave the ice on for no more than 10 to 20 minutes at a time.  Check your skin after 5 minutes to make sure your skin is okay.  Rest at least  20 minutes between ice pack uses. °· Stop using ice when your skin loses feeling (numbness). °· Do not use ice on someone who cannot tell you when it hurts. This includes small children and people with memory problems (dementia). °GET HELP RIGHT AWAY IF: °· You have white spots on your skin. °· Your skin turns blue or pale. °· Your skin feels waxy or hard. °· Your puffiness gets worse. °MAKE SURE YOU:  °· Understand these instructions. °· Will watch your condition. °· Will get help right away if you are not doing well or get worse. °  °This information is not  intended to replace advice given to you by your health care provider. Make sure you discuss any questions you have with your health care provider. °  °Document Released: 12/27/2007 Document Revised: 10/02/2011 Document Reviewed: 03/02/2011 °Elsevier Interactive Patient Education ©2016 Elsevier Inc. ° °

## 2015-10-27 ENCOUNTER — Encounter: Payer: Self-pay | Admitting: Family

## 2015-12-01 DIAGNOSIS — H5213 Myopia, bilateral: Secondary | ICD-10-CM | POA: Diagnosis not present

## 2016-01-03 DIAGNOSIS — E669 Obesity, unspecified: Secondary | ICD-10-CM | POA: Diagnosis not present

## 2016-01-03 DIAGNOSIS — E103299 Type 1 diabetes mellitus with mild nonproliferative diabetic retinopathy without macular edema, unspecified eye: Secondary | ICD-10-CM | POA: Diagnosis not present

## 2016-01-03 DIAGNOSIS — R030 Elevated blood-pressure reading, without diagnosis of hypertension: Secondary | ICD-10-CM | POA: Diagnosis not present

## 2016-01-03 DIAGNOSIS — E109 Type 1 diabetes mellitus without complications: Secondary | ICD-10-CM | POA: Diagnosis not present

## 2016-01-13 ENCOUNTER — Encounter: Payer: Self-pay | Admitting: Physician Assistant

## 2016-01-13 ENCOUNTER — Ambulatory Visit: Payer: Self-pay | Admitting: Physician Assistant

## 2016-01-13 VITALS — BP 130/90 | HR 88 | Temp 98.7°F

## 2016-01-13 DIAGNOSIS — J069 Acute upper respiratory infection, unspecified: Secondary | ICD-10-CM

## 2016-01-13 MED ORDER — AZITHROMYCIN 250 MG PO TABS: ORAL_TABLET | ORAL | Status: DC

## 2016-01-13 MED ORDER — BENZONATATE 200 MG PO CAPS
200.0000 mg | ORAL_CAPSULE | Freq: Three times a day (TID) | ORAL | Status: DC | PRN
Start: 2016-01-13 — End: 2017-11-20

## 2016-01-13 NOTE — Progress Notes (Signed)
S: C/o cough and congestion for over 7 days, no fever, chills, cp/sob, v/d; mucus was green this am but clear throughout the day, cough is sporadic, coughed so hard last night she threw up  Using otc meds: robitussin  O: PE: vtials wnl, nad, perrl eomi, normocephalic, tms dull, nasal mucosa red and swollen, throat injected, neck supple no lymph, lungs c t a, cv rrr, neuro intact  A:  Acute uri   P: zpack, tessalon perls, drink fluids, continue regular meds , use otc meds of choice, return if not improving in 5 days, return earlier if worsening

## 2016-03-03 ENCOUNTER — Emergency Department: Payer: 59

## 2016-03-03 ENCOUNTER — Emergency Department
Admission: EM | Admit: 2016-03-03 | Discharge: 2016-03-03 | Disposition: A | Discharge status: Home / Self Care | Payer: 59 | Attending: Emergency Medicine | Admitting: Emergency Medicine

## 2016-03-03 ENCOUNTER — Encounter: Payer: Self-pay | Admitting: Intensive Care

## 2016-03-03 DIAGNOSIS — Y9389 Activity, other specified: Secondary | ICD-10-CM | POA: Diagnosis not present

## 2016-03-03 DIAGNOSIS — M79601 Pain in right arm: Secondary | ICD-10-CM

## 2016-03-03 DIAGNOSIS — E119 Type 2 diabetes mellitus without complications: Secondary | ICD-10-CM | POA: Diagnosis not present

## 2016-03-03 DIAGNOSIS — Z794 Long term (current) use of insulin: Secondary | ICD-10-CM | POA: Diagnosis not present

## 2016-03-03 DIAGNOSIS — M25422 Effusion, left elbow: Secondary | ICD-10-CM | POA: Diagnosis not present

## 2016-03-03 DIAGNOSIS — Y999 Unspecified external cause status: Secondary | ICD-10-CM | POA: Diagnosis not present

## 2016-03-03 DIAGNOSIS — Y92481 Parking lot as the place of occurrence of the external cause: Secondary | ICD-10-CM | POA: Insufficient documentation

## 2016-03-03 DIAGNOSIS — M25421 Effusion, right elbow: Secondary | ICD-10-CM | POA: Diagnosis not present

## 2016-03-03 DIAGNOSIS — W010XXA Fall on same level from slipping, tripping and stumbling without subsequent striking against object, initial encounter: Secondary | ICD-10-CM | POA: Diagnosis not present

## 2016-03-03 DIAGNOSIS — M79621 Pain in right upper arm: Secondary | ICD-10-CM | POA: Diagnosis not present

## 2016-03-03 MED ORDER — HYDROCODONE-ACETAMINOPHEN 7.5-325 MG/15ML PO SOLN: 10.0000 mL | Freq: Four times a day (QID) | ORAL | 0 refills | Status: AC | PRN

## 2016-03-03 MED ORDER — HYDROCODONE-ACETAMINOPHEN 7.5-325 MG/15ML PO SOLN
10.0000 mL | Freq: Once | ORAL | Status: AC
  Administered 2016-03-03: 10 mL via ORAL
  Filled 2016-03-03: qty 15

## 2016-03-03 NOTE — ED Provider Notes (Signed)
[facility] Emergency Department Provider Note ____________________________________________  Time seen: Approximately 10:16 PM  I have reviewed the triage vital signs and the nursing notes.   HISTORY  Chief Complaint Arm Pain    HPI Katie Griffin is a 30 y.o. female who presents to the emergency department for evaluation of right arm pain. She states she tripped and landed on her outstretched hand, then rolled over and landed on her right shoulder and then flat on her stomach. She has not taken anything for pain prior to arrival. She states she could feel a "crunch" when trying to move her elbow.  Past Medical History:  Diagnosis Date  . Diabetes mellitus without complication [facility])     Patient Active Problem List   Diagnosis Date Noted  . Encounter for routine checking of intrauterine contraceptive device 08/23/2015  . Type 1 diabetes mellitus (HCC) 01/23/2007    Past Surgical History:  Procedure Laterality Date  . APPENDECTOMY    . CESAREAN SECTION      Prior to Admission medications   Medication Sig Start Date End Date Taking? Authorizing Provider  azithromycin (ZITHROMAX Z-PAK) 250 MG tablet 2 pills today then 1 pill a day for 4 days 01/13/16   Faythe Ghee, PA-C  benzonatate (TESSALON) 200 MG capsule Take 1 capsule (200 mg total) by mouth 3 (three) times daily as needed for cough. 01/13/16   Faythe Ghee, PA-C  glucose blood (ONE TOUCH ULTRA TEST) test strip  11/04/14   Historical Provider, MD  HYDROcodone-acetaminophen (HYCET) 7.5-325 mg/15 ml solution Take 10 mLs by mouth 4 (four) times daily as needed for moderate pain. 03/03/16 03/08/16  Chinita Pester, FNP  Insulin Pen Needle (B-D ULTRAFINE III SHORT PEN) 31G X 8 MM MISC  08/02/15   Historical Provider, MD  INSULIN SYRINGE .5CC/28G (B-D INS SYR MICROFINE .5CC/28G) 28G X 1/2" 0.5 ML MISC  09/09/14   Historical Provider, MD  LANTUS SOLOSTAR 100 UNIT/ML Solostar Pen  01/03/16   Historical  Provider, MD  levonorgestrel (MIRENA) 20 MCG/24HR IUD by Intrauterine route. 06/04/15   Historical Provider, MD    Allergies Review of patient's allergies indicates no known allergies.  History reviewed. No pertinent family history.  Social History Social History  Substance Use Topics  . Smoking status: Never Smoker  . Smokeless tobacco: Never Used  . Alcohol use No    Review of Systems Constitutional: No recent illness. Cardiovascular: Denies chest pain or palpitations. Respiratory: Denies shortness of breath. Musculoskeletal: Pain in right arm. Skin: Negative for rash, wound, lesion. Neurological: Negative for focal weakness or numbness.  ____________________________________________   PHYSICAL EXAM:  VITAL SIGNS: ED Triage Vitals  Enc Vitals Group     BP      Pulse      Resp      Temp      Temp src      SpO2      Weight      Height      Head Circumference      Peak Flow      Pain Score      Pain Loc      Pain Edu?      Excl. in GC?     Constitutional: Alert and oriented. Well appearing and in no acute distress. Eyes: Conjunctivae are normal. EOMI. Head: Atraumatic. Neck: No stridor.  Respiratory: Normal respiratory effort.   Musculoskeletal: Normal ROM of right shoulder. Pain to distal humerus and olecranon process to  palpation. Pain over the proximal radius with supination of the right wrist. No pain in the right wrist or hand. Neurologic:  Normal speech and language. No gross focal neurologic deficits are appreciated. Speech is normal. No gait instability. Skin:  Skin is warm, dry and intact. Atraumatic. Psychiatric: Mood and affect are normal. Speech and behavior are normal.  ____________________________________________   LABS (all labs ordered are listed, but only abnormal results are displayed)  Labs Reviewed - No data to display ____________________________________________  RADIOLOGY  Joint effusion noted in the right elbow per  radiology. ____________________________________________   PROCEDURES  Procedure(s) performed: Long arm OCL applied by ER Tech. Neurovascularly intact post application.   ____________________________________________   INITIAL IMPRESSION / ASSESSMENT AND PLAN / ED COURSE  Pertinent labs & imaging results that were available during my care of the patient were reviewed by me and considered in my medical decision making (see chart for details).  Patient to call and schedule an follow up with orthopedics. She is to return to the ER for symptoms that change or worsen if unable to schedule an appointment.  ____________________________________________   FINAL CLINICAL IMPRESSION(S) / ED DIAGNOSES  Final diagnoses:  Joint effusion of elbow, right  Musculoskeletal pain of right upper extremity       Chinita PesterCari B Teosha Casso, FNP 03/04/16 1651    Jennye MoccasinBrian S Quigley, MD 03/06/16 1410

## 2016-03-03 NOTE — ED Triage Notes (Signed)
Pt states "I fell in a parking lot and tried to catch myself and fell on my R arm." Pt c/o pain in R arm all over. Denies shoulder pain. Denies hitting head. A&O X4

## 2016-03-03 NOTE — ED Notes (Signed)
Reviewed d/c instructions, follow-up care and prescriptions with pt. Pt verbalized understanding 

## 2016-03-07 DIAGNOSIS — S53401A Unspecified sprain of right elbow, initial encounter: Secondary | ICD-10-CM | POA: Diagnosis not present

## 2016-08-01 DIAGNOSIS — Z01419 Encounter for gynecological examination (general) (routine) without abnormal findings: Secondary | ICD-10-CM | POA: Diagnosis not present

## 2016-08-01 DIAGNOSIS — Z124 Encounter for screening for malignant neoplasm of cervix: Secondary | ICD-10-CM | POA: Diagnosis not present

## 2016-08-04 DIAGNOSIS — E669 Obesity, unspecified: Secondary | ICD-10-CM | POA: Diagnosis not present

## 2016-08-04 DIAGNOSIS — R030 Elevated blood-pressure reading, without diagnosis of hypertension: Secondary | ICD-10-CM | POA: Diagnosis not present

## 2016-08-04 DIAGNOSIS — E103299 Type 1 diabetes mellitus with mild nonproliferative diabetic retinopathy without macular edema, unspecified eye: Secondary | ICD-10-CM | POA: Diagnosis not present

## 2016-08-04 DIAGNOSIS — I10 Essential (primary) hypertension: Secondary | ICD-10-CM | POA: Diagnosis not present

## 2016-08-04 DIAGNOSIS — Z794 Long term (current) use of insulin: Secondary | ICD-10-CM | POA: Diagnosis not present

## 2016-11-24 ENCOUNTER — Emergency Department
Admission: EM | Admit: 2016-11-24 | Discharge: 2016-11-25 | Disposition: A | Discharge status: Home / Self Care | Payer: 59 | Attending: Student in an Organized Health Care Education/Training Program | Admitting: Student in an Organized Health Care Education/Training Program

## 2016-11-24 ENCOUNTER — Emergency Department: Payer: 59

## 2016-11-24 DIAGNOSIS — E109 Type 1 diabetes mellitus without complications: Secondary | ICD-10-CM | POA: Insufficient documentation

## 2016-11-24 DIAGNOSIS — Y929 Unspecified place or not applicable: Secondary | ICD-10-CM | POA: Insufficient documentation

## 2016-11-24 DIAGNOSIS — S8991XA Unspecified injury of right lower leg, initial encounter: Secondary | ICD-10-CM | POA: Diagnosis not present

## 2016-11-24 DIAGNOSIS — S8001XA Contusion of right knee, initial encounter: Secondary | ICD-10-CM

## 2016-11-24 DIAGNOSIS — W1839XA Other fall on same level, initial encounter: Secondary | ICD-10-CM | POA: Insufficient documentation

## 2016-11-24 DIAGNOSIS — S8391XA Sprain of unspecified site of right knee, initial encounter: Secondary | ICD-10-CM | POA: Insufficient documentation

## 2016-11-24 DIAGNOSIS — Y939 Activity, unspecified: Secondary | ICD-10-CM | POA: Insufficient documentation

## 2016-11-24 DIAGNOSIS — Y999 Unspecified external cause status: Secondary | ICD-10-CM | POA: Diagnosis not present

## 2016-11-24 DIAGNOSIS — M25561 Pain in right knee: Secondary | ICD-10-CM | POA: Diagnosis not present

## 2016-11-24 MED ORDER — ACETAMINOPHEN-CODEINE #3 300-30 MG PO TABS
1.0000 | ORAL_TABLET | Freq: Once | ORAL | Status: DC
  Filled 2016-11-24: qty 1

## 2016-11-24 MED ORDER — HYDROCODONE-ACETAMINOPHEN 5-325 MG PO TABS: 1.0000 | ORAL_TABLET | Freq: Once | ORAL | Status: DC

## 2016-11-24 MED ORDER — ACETAMINOPHEN-CODEINE 120-12 MG/5ML PO SUSP: 5.0000 mL | Freq: Three times a day (TID) | ORAL | 0 refills | Status: AC | PRN

## 2016-11-24 NOTE — ED Triage Notes (Signed)
Patient reports her right knee gave out on her; she subsequently fell to right knee and is currently c/o right knee pain/weakness.

## 2016-11-24 NOTE — Discharge Instructions (Signed)
You knee exam and x-rays are normal at this time. Take the prescription pain medicine as needed. Take Tylenol as needed for pain relief. Follow-up with Dr. Joice LoftsPoggi for ongoing symptoms. Apply ice to reduce symptoms.

## 2016-11-26 NOTE — ED Provider Notes (Signed)
[facility] Emergency Department Provider Note ____________________________________________  Time seen: 2303  I have reviewed the triage vital signs and the nursing notes.  HISTORY  Chief Complaint  Knee Pain  HPI Katie Griffin is a 31 y.o. female Presents to the ED for evaluation of right knee pain.The patient describes her knee given out as she went to stand. This gave-way caused her to fall onto her right knee. She describes right knee pain and denies any other injury at this time. She is not taking any medications or applied any ice to the knee for interim pain management. She denies a history of ongoing or recurrent knee problems.  Past Medical History:  Diagnosis Date  . Diabetes mellitus without complication [facility])     Patient Active Problem List   Diagnosis Date Noted  . Encounter for routine checking of intrauterine contraceptive device 08/23/2015  . Type 1 diabetes mellitus (HCC) 01/23/2007    Past Surgical History:  Procedure Laterality Date  . APPENDECTOMY    . CESAREAN SECTION      Prior to Admission medications   Medication Sig Start Date End Date Taking? Authorizing Provider  acetaminophen-codeine 120-12 MG/5ML suspension Take 5 mLs by mouth every 8 (eight) hours as needed for pain. 11/24/16 11/26/16  Kevin Mario, Charlesetta Ivory, PA-C  azithromycin (ZITHROMAX Z-PAK) 250 MG tablet 2 pills today then 1 pill a day for 4 days 01/13/16   Sherrie Mustache Roselyn Bering, PA-C  benzonatate (TESSALON) 200 MG capsule Take 1 capsule (200 mg total) by mouth 3 (three) times daily as needed for cough. 01/13/16   Fisher, Roselyn Bering, PA-C  glucose blood (ONE TOUCH ULTRA TEST) test strip  11/04/14   [provider]  Insulin Pen Needle (B-D ULTRAFINE III SHORT PEN) 31G X 8 MM MISC  08/02/15   [provider]  INSULIN SYRINGE .5CC/28G (B-D INS SYR MICROFINE .5CC/28G) 28G X 1/2" 0.5 ML MISC  09/09/14   [provider]  LANTUS SOLOSTAR 100 UNIT/ML Solostar Pen   01/03/16   [provider]  levonorgestrel (MIRENA) 20 MCG/24HR IUD by Intrauterine route. 06/04/15   [provider]    Allergies Patient has no known allergies.  Family History  Problem Relation Age of Onset  . Hypertension Mother   . Hypertension Father     Social History Social History  Substance Use Topics  . Smoking status: Never Smoker  . Smokeless tobacco: Never Used  . Alcohol use No    Review of Systems  Constitutional: Negative for fever. Cardiovascular: Negative for chest pain. Respiratory: Negative for shortness of breath. Musculoskeletal: Negative for back pain. Right knee pain as above. Skin: Negative for rash. Neurological: Negative for headaches, focal weakness or numbness. ____________________________________________  PHYSICAL EXAM:  VITAL SIGNS: ED Triage Vitals  Enc Vitals Group     BP 11/24/16 2217 (!) 158/104     Pulse Rate 11/24/16 2217 (!) 101     Resp 11/24/16 2217 18     Temp 11/24/16 2217 98.3 F (36.8 C)     Temp Source 11/24/16 2217 Oral     SpO2 11/24/16 2217 100 %     Weight 11/24/16 2217 203 lb (92.1 kg)     Height 11/24/16 2217 5\' 2"  (1.575 m)     Head Circumference --      Peak Flow --      Pain Score 11/24/16 2219 9     Pain Loc --      Pain Edu? --  Excl. in GC? --     Constitutional: Alert and oriented. Well appearing and in no distress. Head: Normocephalic and atraumatic. Cardiovascular: Normal rate, regular rhythm. Normal distal pulses. Respiratory: Normal respiratory effort.  Musculoskeletal: Right knee without any obvious deformity, dislocation, or effusion. Patient is slightly tender to palpation over the anterior patella. The knee is able to flex and extend without difficulty, or crepitus. No valgus or varus joint stress is appreciated. Popliteal space fullness is noted.Negative anterior/posterior drawer. Nontender with normal range of motion in all extremities.  Neurologic:  antalgicgait without  ataxia. Normal speech and language. No gross focal neurologic deficits are appreciated. ____________________________________________   RADIOLOGY  Right Knee IMPRESSION: Negative. ____________________________________________  PROCEDURES  Ace bandage ____________________________________________  INITIAL IMPRESSION / ASSESSMENT AND PLAN / ED COURSE  Patient with right knee contusion and sprain without radiologic evidence of fracture dislocation. She has normal exam without any signs of internal derangement. She is discharged with a small Tylenol with codeine elixir, since the patient has difficulty swallowing pills. She will apply ice, rest, and elevate the knee as needed. She will follow with Dr. Joice LoftsPoggi for ongoing symptom management. ____________________________________________  FINAL CLINICAL IMPRESSION(S) / ED DIAGNOSES  Final diagnoses:  Sprain of right knee, unspecified ligament, initial encounter  Contusion of right knee, initial encounter      Lissa HoardMenshew, Jama Mcmiller V Bacon, PA-C 11/26/16 0144    Willy Eddyobinson, Patrick, MD 11/26/16 [phone]

## 2017-03-23 DIAGNOSIS — Z6830 Body mass index (BMI) 30.0-30.9, adult: Secondary | ICD-10-CM | POA: Diagnosis not present

## 2017-03-23 DIAGNOSIS — R05 Cough: Secondary | ICD-10-CM | POA: Diagnosis not present

## 2017-03-23 DIAGNOSIS — R030 Elevated blood-pressure reading, without diagnosis of hypertension: Secondary | ICD-10-CM | POA: Diagnosis not present

## 2017-03-23 DIAGNOSIS — R600 Localized edema: Secondary | ICD-10-CM | POA: Diagnosis not present

## 2017-03-23 DIAGNOSIS — E669 Obesity, unspecified: Secondary | ICD-10-CM | POA: Diagnosis not present

## 2017-03-23 DIAGNOSIS — E103299 Type 1 diabetes mellitus with mild nonproliferative diabetic retinopathy without macular edema, unspecified eye: Secondary | ICD-10-CM | POA: Diagnosis not present

## 2017-03-23 DIAGNOSIS — Z794 Long term (current) use of insulin: Secondary | ICD-10-CM | POA: Diagnosis not present

## 2017-03-23 DIAGNOSIS — O2493 Unspecified diabetes mellitus in the puerperium: Secondary | ICD-10-CM | POA: Diagnosis not present

## 2017-03-23 DIAGNOSIS — I10 Essential (primary) hypertension: Secondary | ICD-10-CM | POA: Diagnosis not present

## 2017-04-03 DIAGNOSIS — H6122 Impacted cerumen, left ear: Secondary | ICD-10-CM | POA: Diagnosis not present

## 2017-04-03 DIAGNOSIS — H93299 Other abnormal auditory perceptions, unspecified ear: Secondary | ICD-10-CM | POA: Diagnosis not present

## 2017-05-18 DIAGNOSIS — E1065 Type 1 diabetes mellitus with hyperglycemia: Secondary | ICD-10-CM | POA: Diagnosis not present

## 2017-05-18 DIAGNOSIS — E103299 Type 1 diabetes mellitus with mild nonproliferative diabetic retinopathy without macular edema, unspecified eye: Secondary | ICD-10-CM | POA: Diagnosis not present

## 2017-05-18 DIAGNOSIS — E669 Obesity, unspecified: Secondary | ICD-10-CM | POA: Diagnosis not present

## 2017-05-18 DIAGNOSIS — Z794 Long term (current) use of insulin: Secondary | ICD-10-CM | POA: Diagnosis not present

## 2017-05-18 DIAGNOSIS — I10 Essential (primary) hypertension: Secondary | ICD-10-CM | POA: Diagnosis not present

## 2017-05-18 DIAGNOSIS — R030 Elevated blood-pressure reading, without diagnosis of hypertension: Secondary | ICD-10-CM | POA: Diagnosis not present

## 2017-05-23 DIAGNOSIS — H5213 Myopia, bilateral: Secondary | ICD-10-CM | POA: Diagnosis not present

## 2017-06-20 ENCOUNTER — Encounter: Payer: Self-pay | Admitting: *Deleted

## 2017-06-20 ENCOUNTER — Other Ambulatory Visit: Payer: Self-pay

## 2017-06-20 ENCOUNTER — Emergency Department
Admission: EM | Admit: 2017-06-20 | Discharge: 2017-06-20 | Disposition: A | Discharge status: Home / Self Care | Payer: 59 | Attending: Emergency Medicine | Admitting: Emergency Medicine

## 2017-06-20 DIAGNOSIS — T17298A Other foreign object in pharynx causing other injury, initial encounter: Secondary | ICD-10-CM | POA: Diagnosis not present

## 2017-06-20 DIAGNOSIS — Y929 Unspecified place or not applicable: Secondary | ICD-10-CM | POA: Diagnosis not present

## 2017-06-20 DIAGNOSIS — X58XXXA Exposure to other specified factors, initial encounter: Secondary | ICD-10-CM | POA: Diagnosis not present

## 2017-06-20 DIAGNOSIS — Z794 Long term (current) use of insulin: Secondary | ICD-10-CM | POA: Insufficient documentation

## 2017-06-20 DIAGNOSIS — T17208A Unspecified foreign body in pharynx causing other injury, initial encounter: Secondary | ICD-10-CM

## 2017-06-20 DIAGNOSIS — Y939 Activity, unspecified: Secondary | ICD-10-CM | POA: Insufficient documentation

## 2017-06-20 DIAGNOSIS — Y999 Unspecified external cause status: Secondary | ICD-10-CM | POA: Diagnosis not present

## 2017-06-20 DIAGNOSIS — Z79899 Other long term (current) drug therapy: Secondary | ICD-10-CM | POA: Insufficient documentation

## 2017-06-20 DIAGNOSIS — E109 Type 1 diabetes mellitus without complications: Secondary | ICD-10-CM | POA: Insufficient documentation

## 2017-06-20 MED ORDER — BENZOCAINE 20 % MT SOLN
OROMUCOSAL | Status: AC
  Administered 2017-06-20: 1
  Filled 2017-06-20: qty 5

## 2017-06-20 NOTE — ED Triage Notes (Signed)
Pt has a fish bone hung in throat for 1 hour. Pt talking without diff.  Pt vomited x 3.  Pt unable to eat or drink.

## 2017-06-20 NOTE — ED Notes (Signed)
No xrays per dr Alphonzo Lemmingsmcshane.

## 2017-06-20 NOTE — Discharge Instructions (Signed)
Return to the emergency room for increased pain, swelling, fever, difficulty breathing or swallowing or other concerns.  Take Tylenol or Motrin for the pain.

## 2017-06-20 NOTE — ED Provider Notes (Addendum)
[facility] Emergency Department Provider Note  ____________________________________________   I have reviewed the triage vital signs and the nursing notes.   HISTORY  Chief Complaint Swallowed Foreign Body    HPI Katie Griffin is a 31 y.o. female who is healthy at baseline, does have diabetes, and an hour and a half ago she was eating fish and felt a fishbone got stuck in her left tonsil.  It has been there ever since.  No other complaints or complications.  Able to talk and swallow.  Hurts to swallow no breathing issues she can see it  Location tonsil Radiation none Quality sharp Duration 1-1/2 Timing see above Severity mild Associated sxs none PriorTreatment none   Past Medical History:  Diagnosis Date  . Diabetes mellitus without complication [facility](HCC)     Patient Active Problem List   Diagnosis Date Noted  . Encounter for routine checking of intrauterine contraceptive device 08/23/2015  . Type 1 diabetes mellitus (HCC) 01/23/2007    Past Surgical History:  Procedure Laterality Date  . APPENDECTOMY    . CESAREAN SECTION      Prior to Admission medications   Medication Sig Start Date End Date Taking? Authorizing Provider  azithromycin (ZITHROMAX Z-PAK) 250 MG tablet 2 pills today then 1 pill a day for 4 days 01/13/16   Sherrie MustacheFisher, Roselyn BeringSusan W, PA-C  benzonatate (TESSALON) 200 MG capsule Take 1 capsule (200 mg total) by mouth 3 (three) times daily as needed for cough. 01/13/16   Fisher, Roselyn BeringSusan W, PA-C  glucose blood (ONE TOUCH ULTRA TEST) test strip  11/04/14   [provider]  Insulin Pen Needle (B-D ULTRAFINE III SHORT PEN) 31G X 8 MM MISC  08/02/15   [provider]  INSULIN SYRINGE .5CC/28G (B-D INS SYR MICROFINE .5CC/28G) 28G X 1/2" 0.5 ML MISC  09/09/14   [provider]  LANTUS SOLOSTAR 100 UNIT/ML Solostar Pen  01/03/16   [provider]  levonorgestrel (MIRENA) 20 MCG/24HR IUD by Intrauterine route. 06/04/15    [provider]    Allergies Patient has no known allergies.  Family History  Problem Relation Age of Onset  . Hypertension Mother   . Hypertension Father     Social History Social History   Tobacco Use  . Smoking status: Never Smoker  . Smokeless tobacco: Never Used  Substance Use Topics  . Alcohol use: No  . Drug use: Not on file    Review of Systems Constitutional: No fever/chills    ____________________________________________   PHYSICAL EXAM:  VITAL SIGNS: ED Triage Vitals  Enc Vitals Group     BP 06/20/17 2055 (!) 145/97     Pulse Rate 06/20/17 2055 (!) 103     Resp 06/20/17 2055 20     Temp 06/20/17 2055 98.4 F (36.9 C)     Temp Source 06/20/17 2055 Oral     SpO2 06/20/17 2055 100 %     Weight 06/20/17 2056 203 lb 11.3 oz (92.4 kg)     Height 06/20/17 2056 5\' 1"  (1.549 m)     Head Circumference --      Peak Flow --      Pain Score 06/20/17 2055 6     Pain Loc --      Pain Edu? --      Excl. in GC? --     Constitutional: Alert and oriented. Well appearing and in no acute distress. Eyes: Conjunctivae are normal Head: Atraumatic HEENT: No congestion/rhinnorhea. Mucous membranes  are moist.  Oropharynx non-erythematous, small fishbone stuck in the left tonsil Neck:   Nontender with no meningismus, no masses, no stridor  Psychiatric: Mood and affect are normal. Speech and behavior are normal.  ____________________________________________   LABS (all labs ordered are listed, but only abnormal results are displayed)  Labs Reviewed - No data to display  Pertinent labs  results that were available during my care of the patient were reviewed by me and considered in my medical decision making (see chart for details). ____________________________________________  EKG  I personally interpreted any EKGs ordered by me or triage  ____________________________________________  RADIOLOGY  Pertinent labs & imaging results that were available  during my care of the patient were reviewed by me and considered in my medical decision making (see chart for details). If possible, patient and/or family made aware of any abnormal findings.  No results found. ____________________________________________    PROCEDURES  Procedure(s) performed: None  .Foreign Body Removal Date/Time: 06/20/2017 9:57 PM Performed by: Jeanmarie PlantMcShane, Alexandru Moorer A, MD Authorized by: Jeanmarie PlantMcShane, Ayrton Mcvay A, MD  Consent: Verbal consent obtained. Written consent not obtained. Risks and benefits: risks, benefits and alternatives were discussed Consent given by: patient Patient understanding: patient states understanding of the procedure being performed Patient consent: the patient's understanding of the procedure matches consent given Procedure consent: procedure consent matches procedure scheduled Relevant documents: relevant documents present and verified Test results: test results not available Site marked: the operative site was not marked Imaging studies: imaging studies not available Patient identity confirmed: verbally with patient and arm band Time out: Immediately prior to procedure a "time out" was called to verify the correct patient, procedure, equipment, support staff and site/side marked as required. Body area: throat Anesthesia: see MAR for details  Anesthesia: Local Anesthetic: co-phenylcaine spray  Sedation: Patient sedated: no  Patient restrained: no Localization method: visualized Removal mechanism: alligator forceps Complexity: simple 1 objects recovered. Objects recovered: Fishbone Post-procedure assessment: foreign body removed Patient tolerance: Patient tolerated the procedure well with no immediate complications    Critical Care performed: None  ____________________________________________   INITIAL IMPRESSION / ASSESSMENT AND PLAN / ED COURSE  Pertinent labs & imaging results that were available during my care of the patient were  reviewed by me and considered in my medical decision making (see chart for details).  Patient had a fishbone reported out with alligator forceps no bleeding no complications, before giving Hurricaine spray we verified patient does not have G6PD.  Return precautions and follow-up given for infection bleeding etc.    ____________________________________________   FINAL CLINICAL IMPRESSION(S) / ED DIAGNOSES  Final diagnoses:  None      This chart was dictated using voice recognition software.  Despite best efforts to proofread,  errors can occur which can change meaning.      Jeanmarie PlantMcShane, Hoover Grewe A, MD 06/20/17 2159    Jeanmarie PlantMcShane, Harley Fitzwater A, MD 06/20/17 2159

## 2017-06-20 NOTE — ED Notes (Signed)
Fish bone successfully removed form pt throat by MD. Pt tolerated well. No difficulty swallowing at this time. Pt given a cup of water to tolerate.

## 2017-11-20 ENCOUNTER — Ambulatory Visit (INDEPENDENT_AMBULATORY_CARE_PROVIDER_SITE_OTHER): Payer: Self-pay | Admitting: Family Medicine

## 2017-11-20 ENCOUNTER — Encounter: Payer: Self-pay | Admitting: Family Medicine

## 2017-11-20 VITALS — BP 130/90 | Temp 98.6°F | Wt 207.0 lb

## 2017-11-20 DIAGNOSIS — J301 Allergic rhinitis due to pollen: Secondary | ICD-10-CM

## 2017-11-20 MED ORDER — IPRATROPIUM BROMIDE 0.03 % NA SOLN: 2.0000 | Freq: Two times a day (BID) | NASAL | 0 refills | Status: DC

## 2017-11-20 MED ORDER — LEVOCETIRIZINE DIHYDROCHLORIDE 5 MG PO TABS: 5.0000 mg | ORAL_TABLET | Freq: Every evening | ORAL | 2 refills | Status: DC

## 2017-11-20 MED ORDER — BENZONATATE 100 MG PO CAPS: 100.0000 mg | ORAL_CAPSULE | Freq: Three times a day (TID) | ORAL | 0 refills | Status: DC | PRN

## 2017-11-20 NOTE — Patient Instructions (Signed)

## 2017-11-20 NOTE — Progress Notes (Signed)
Patient ID: Katie Griffin, female    DOB: Aug 31, 1985, 32 y.o.   MRN: [id number]  PCP: Patient, No Pcp Per  Chief Complaint  Patient presents with  . URI    4 days     Subjective:  HPI Katie BILYEU is a 32 y.o. female presents for evaluation of upper respiratory infection. Patient has a medical history significant for insulin dependent diabetes, hypertension, hyperlipidemia, and obesity. Symptoms of sore throat, nasal congestion and non-productive cough started 4 days prior. She reports one day of headache which resolved without intervention. She is negative of fever, chills, shortness of breath, and or body aches. She has not attempted relief with any over the counter medication. Social History   Socioeconomic History  . Marital status: Single    Spouse name: Not on file  . Number of children: Not on file  . Years of education: Not on file  . Highest education level: Not on file  Occupational History  . Not on file  Social Needs  . Financial resource strain: Not on file  . Food insecurity:    Worry: Not on file    Inability: Not on file  . Transportation needs:    Medical: Not on file    Non-medical: Not on file  Tobacco Use  . Smoking status: Never Smoker  . Smokeless tobacco: Never Used  Substance and Sexual Activity  . Alcohol use: No  . Drug use: Not on file  . Sexual activity: Yes    Birth control/protection: IUD  Lifestyle  . Physical activity:    Days per week: Not on file    Minutes per session: Not on file  . Stress: Not on file  Relationships  . Social connections:    Talks on phone: Not on file    Gets together: Not on file    Attends religious service: Not on file    Active member of club or organization: Not on file    Attends meetings of clubs or organizations: Not on file    Relationship status: Not on file  . Intimate partner violence:    Fear of current or ex partner: Not on file    Emotionally abused: Not on file    Physically abused: Not  on file    Forced sexual activity: Not on file  Other Topics Concern  . Not on file  Social History Narrative  . Not on file    Family History  Problem Relation Age of Onset  . Hypertension Mother   . Hypertension Father    Review of Systems Pertinent Negative Listed in HPI   Patient Active Problem List   Diagnosis Date Noted  . Encounter for routine checking of intrauterine contraceptive device 08/23/2015  . Type 1 diabetes mellitus (HCC) 01/23/2007    No Known Allergies  Prior to Admission medications   Medication Sig Start Date End Date Taking? Authorizing Provider  amLODipine (NORVASC) 10 MG tablet Take by mouth. 10/12/17 10/12/18 Yes [provider]  atorvastatin (LIPITOR) 10 MG tablet Take by mouth. 03/24/17 03/24/18 Yes [provider]  glucose blood (ONE TOUCH ULTRA TEST) test strip  11/04/14  Yes [provider]  hydrochlorothiazide (HYDRODIURIL) 12.5 MG tablet Take by mouth. 04/04/17 04/04/18 Yes [provider]  insulin lispro (HUMALOG KWIKPEN) 100 UNIT/ML KiwkPen Humalog KwikPen (U-100) Insulin 100 unit/mL subcutaneous 08/19/13  Yes [provider]  Insulin Pen Needle (B-D ULTRAFINE III SHORT PEN) 31G X 8 MM MISC  08/02/15  Yes  [provider]  INSULIN SYRINGE .5CC/28G (B-D INS SYR MICROFINE .5CC/28G) 28G X 1/2" 0.5 ML MISC  09/09/14  Yes [provider]  LANTUS SOLOSTAR 100 UNIT/ML Solostar Pen  01/03/16  Yes [provider]  levonorgestrel (MIRENA) 20 MCG/24HR IUD by Intrauterine route. 06/04/15  Yes [provider]  meloxicam (MOBIC) 15 MG tablet meloxicam 15 mg tablet  Take 1 tablet every day by oral route with meals.   Yes [provider]  metFORMIN (GLUCOPHAGE) 500 MG tablet Take by mouth. 10/12/17 10/12/18 Yes [provider]    Past Medical, Surgical Family and Social History reviewed and updated.    Objective:   Today's Vitals   11/20/17 1453  BP: 130/90  Temp: 98.6  F (37 C)  SpO2: 100%  Weight: 207 lb (93.9 kg)    Wt Readings from Last 3 Encounters:  11/20/17 207 lb (93.9 kg)  06/20/17 203 lb 11.3 oz (92.4 kg)  11/24/16 203 lb (92.1 kg)    Physical Exam Constitutional: Patient appears well-developed and well-nourished. No distress. HENT: Normocephalic, atraumatic, External right and left ear normal. Oropharynx is clear and moist.  Eyes: Conjunctivae and EOM are normal. PERRLA, no scleral icterus. Neck: Normal ROM. Neck supple. No JVD. No tracheal deviation. No thyromegaly. CVS: RRR, S1/S2 +, no murmurs, no gallops, no carotid bruit.  Pulmonary: Effort and breath sounds normal, no stridor, rhonchi, wheezes, rales.  Lymphadenopathy: No cervical lymphadenopathy noted. Neuro: Alert. Normal reflexes, muscle tone coordination. No cranial nerve deficit. Skin: Skin is warm and dry. No rash noted. Not diaphoretic. No erythema. No pallor. Psychiatric: Normal mood and affect. Behavior, judgment, thought content normal.  Assessment & Plan:  1. Allergic rhinitis due to pollen, unspecified seasonality Patient is well-appearing today. I will initiate an anti-allergy regimen today and prescribe anti-tussive therapy for cough which is intermittent. Antibiotic therapy is not warranted. See medication orders:  Meds ordered this encounter  Medications  . levocetirizine (XYZAL) 5 MG tablet    Sig: Take 1 tablet (5 mg total) by mouth every evening.    Dispense:  30 tablet    Refill:  2  . ipratropium (ATROVENT) 0.03 % nasal spray    Sig: Place 2 sprays into both nostrils 2 (two) times daily.    Dispense:  30 mL    Refill:  0  . benzonatate (TESSALON) 100 MG capsule    Sig: Take 1-2 capsules (100-200 mg total) by mouth 3 (three) times daily as needed for cough.    Dispense:  40 capsule    Refill:  0     If symptoms worsen or do not improve, return for follow-up, follow-up with PCP, or at the emergency department if severity of symptoms warrant a higher  level of care.    Katie Griffin. Katie Pea, MSN, FNP-C [facility]  [address]  [place], [place] [address]

## 2018-03-06 ENCOUNTER — Ambulatory Visit (INDEPENDENT_AMBULATORY_CARE_PROVIDER_SITE_OTHER): Payer: Self-pay | Admitting: Family Medicine

## 2018-03-06 ENCOUNTER — Encounter: Payer: Self-pay | Admitting: Family Medicine

## 2018-03-06 VITALS — BP 140/110 | HR 103 | Temp 98.5°F | Wt 199.0 lb

## 2018-03-06 DIAGNOSIS — J029 Acute pharyngitis, unspecified: Secondary | ICD-10-CM

## 2018-03-06 LAB — POCT RAPID STREP A (OFFICE): RAPID STREP A SCREEN: NEGATIVE

## 2018-03-06 MED ORDER — FLUCONAZOLE 150 MG PO TABS: 150.0000 mg | ORAL_TABLET | Freq: Once | ORAL | 0 refills | Status: AC

## 2018-03-06 MED ORDER — CEFDINIR 300 MG PO CAPS: 600.0000 mg | ORAL_CAPSULE | Freq: Every day | ORAL | 0 refills | Status: DC

## 2018-03-06 NOTE — Progress Notes (Signed)
Patient ID: Katie Griffin, female    DOB: 10/31/1985, 32 y.o.   MRN: [id number]  PCP: Patient, No Pcp Per  Chief Complaint  Patient presents with  . Sore Throat    Subjective:  HPI Katie Griffin is a 32 y.o. female presents for evaluation sore throat. SORE THROAT Onset: 1 days    Severity: moderate Tried OTC meds without significant relief.  Symptoms:  + Fever  + Swollen neck glands +No Recent known strep exposure +Headache     No Myalgias  No Rash  No discolored nasal mucus No Allergy symptoms No sinus pain/pressure No itchy/red eyes No earache  No Nausea No Vomiting No abdominal pain No Diarrhea Negative of any associated URI symptoms  Social History   Socioeconomic History  . Marital status: Single    Spouse name: Not on file  . Number of children: Not on file  . Years of education: Not on file  . Highest education level: Not on file  Occupational History  . Not on file  Social Needs  . Financial resource strain: Not on file  . Food insecurity:    Worry: Not on file    Inability: Not on file  . Transportation needs:    Medical: Not on file    Non-medical: Not on file  Tobacco Use  . Smoking status: Never Smoker  . Smokeless tobacco: Never Used  Substance and Sexual Activity  . Alcohol use: No  . Drug use: Not on file  . Sexual activity: Yes    Birth control/protection: IUD  Lifestyle  . Physical activity:    Days per week: Not on file    Minutes per session: Not on file  . Stress: Not on file  Relationships  . Social connections:    Talks on phone: Not on file    Gets together: Not on file    Attends religious service: Not on file    Active member of club or organization: Not on file    Attends meetings of clubs or organizations: Not on file    Relationship status: Not on file  . Intimate partner violence:    Fear of current or ex partner: Not on file    Emotionally abused: Not on file    Physically abused: Not on file    Forced  sexual activity: Not on file  Other Topics Concern  . Not on file  Social History Narrative  . Not on file    Family History  Problem Relation Age of Onset  . Hypertension Mother   . Hypertension Father     Review of Systems  Pertinent negatives listed in HPI  Patient Active Problem List   Diagnosis Date Noted  . Encounter for routine checking of intrauterine contraceptive device 08/23/2015  . Type 1 diabetes mellitus (HCC) 01/23/2007    No Known Allergies  Prior to Admission medications   Medication Sig Start Date End Date Taking? Authorizing Provider  amLODipine (NORVASC) 10 MG tablet Take by mouth. 10/12/17 10/12/18 Yes [provider]  atorvastatin (LIPITOR) 10 MG tablet Take by mouth. 03/24/17 03/24/18 Yes [provider]  glucose blood (ONE TOUCH ULTRA TEST) test strip  11/04/14  Yes [provider]  hydrochlorothiazide (HYDRODIURIL) 12.5 MG tablet Take by mouth. 04/04/17 04/04/18 Yes [provider]  insulin lispro (HUMALOG KWIKPEN) 100 UNIT/ML KiwkPen Humalog KwikPen (U-100) Insulin 100 unit/mL subcutaneous 08/19/13  Yes [provider]  Insulin Pen Needle (B-D ULTRAFINE III SHORT PEN) 31G X  8 MM MISC  08/02/15  Yes [provider]  INSULIN SYRINGE .5CC/28G (B-D INS SYR MICROFINE .5CC/28G) 28G X 1/2" 0.5 ML MISC  09/09/14  Yes [provider]  LANTUS SOLOSTAR 100 UNIT/ML Solostar Pen  01/03/16  Yes [provider]  levonorgestrel (MIRENA) 20 MCG/24HR IUD by Intrauterine route. 06/04/15  Yes [provider]  metFORMIN (GLUCOPHAGE) 500 MG tablet Take by mouth. 10/12/17 10/12/18 Yes [provider]  benzonatate (TESSALON) 100 MG capsule Take 1-2 capsules (100-200 mg total) by mouth 3 (three) times daily as needed for cough. Patient not taking: Reported on 03/06/2018 11/20/17   Bing NeighborsHarris, Sakari Raisanen S, FNP  ipratropium (ATROVENT) 0.03 % nasal spray Place 2 sprays into both nostrils 2 (two) times  daily. Patient not taking: Reported on 03/06/2018 11/20/17   Bing NeighborsHarris, Veola Cafaro S, FNP  levocetirizine (XYZAL) 5 MG tablet Take 1 tablet (5 mg total) by mouth every evening. Patient not taking: Reported on 03/06/2018 11/20/17   Bing NeighborsHarris, Rosmarie Esquibel S, FNP  meloxicam (MOBIC) 15 MG tablet meloxicam 15 mg tablet  Take 1 tablet every day by oral route with meals.    [provider]    Past Medical, Surgical Family and Social History reviewed and updated.    Objective:   Today's Vitals   03/06/18 0929  BP: (!) 140/110  Pulse: (!) 103  Temp: 98.5 F (36.9 C)  SpO2: 99%  Weight: 199 lb (90.3 kg)    Wt Readings from Last 3 Encounters:  03/06/18 199 lb (90.3 kg)  11/20/17 207 lb (93.9 kg)  06/20/17 203 lb 11.3 oz (92.4 kg)    Physical Exam Physical Exam Sore Throat: Constitutional: Patient appears well-developed and well-nourished. No distress. HENT: Normocephalic, atraumatic, External right and left ear normal.  Oropharynx is erythematous, edematous, with white exudate. Eyes: Conjunctivae and EOM are normal. PERRLA, no scleral icterus. Neck: Normal ROM. Neck supple. No JVD. No tracheal deviation. No thyromegaly. CVS: RRR, S1/S2 +, no murmurs, no gallops, no carotid bruit.  Pulmonary: Effort and breath sounds normal, no stridor, rhonchi, wheezes, rales.  Lymphadenopathy: Cervical adenopathy present. Skin: Skin is warm and dry. No rash noted. Not diaphoretic. No erythema. No pallor. Psychiatric: Normal mood and affect. Behavior, judgment, thought content normal.    Assessment & Plan:  1. Pharyngitis, unspecified etiology.-Rapid strep is negative. Exam of oropharynx is suspicious for a streptococcal infection. Treating empirically with a broad-spectrum antibiotic. Patient advised if symptoms worsen to follow-up here or with PCP. Strep Culture no available at site.    -The patient was given clear instructions to go to ER or return to medical center if symptoms do not improve, worsen  or new problems develop. The patient verbalized understanding.   Godfrey PickKimberly S. Tiburcio PeaHarris, MSN, FNP-C [facility]  [address]  [place], [place] [address] [phone]

## 2018-03-06 NOTE — Patient Instructions (Addendum)
Take all medication as directed and complete all doses.  You may gargle with warm salt water.  I have prescribed Diflucan in the event you develop vaginal irritation with taking antibiotic.    Pharyngitis Pharyngitis is a sore throat (pharynx). There is redness, pain, and swelling of your throat. Follow these instructions at home:  Drink enough fluids to keep your pee (urine) clear or pale yellow.  Only take medicine as told by your doctor. ? You may get sick again if you do not take medicine as told. Finish your medicines, even if you start to feel better. ? Do not take aspirin.  Rest.  Rinse your mouth (gargle) with salt water ( tsp of salt per 1 qt of water) every 1-2 hours. This will help the pain.  If you are not at risk for choking, you can suck on hard candy or sore throat lozenges. Contact a doctor if:  You have large, tender lumps on your neck.  You have a rash.  You cough up green, yellow-brown, or bloody spit. Get help right away if:  You have a stiff neck.  You drool or cannot swallow liquids.  You throw up (vomit) or are not able to keep medicine or liquids down.  You have very bad pain that does not go away with medicine.  You have problems breathing (not from a stuffy nose). This information is not intended to replace advice given to you by your health care provider. Make sure you discuss any questions you have with your health care provider. Document Released: 12/27/2007 Document Revised: 12/16/2015 Document Reviewed: 03/17/2013 Elsevier Interactive Patient Education  2017 [employer]

## 2018-03-08 ENCOUNTER — Telehealth: Payer: Self-pay | Admitting: Emergency Medicine

## 2018-03-08 NOTE — Telephone Encounter (Signed)
Patient requested work note. Per provider work excuse given to patient

## 2018-03-26 ENCOUNTER — Ambulatory Visit: Payer: No Typology Code available for payment source | Attending: Orthopedic Surgery | Admitting: Physical Therapy

## 2018-03-26 ENCOUNTER — Other Ambulatory Visit: Payer: Self-pay

## 2018-03-26 ENCOUNTER — Encounter: Payer: Self-pay | Admitting: Physical Therapy

## 2018-03-26 DIAGNOSIS — M25561 Pain in right knee: Secondary | ICD-10-CM | POA: Insufficient documentation

## 2018-03-26 DIAGNOSIS — M6281 Muscle weakness (generalized): Secondary | ICD-10-CM | POA: Insufficient documentation

## 2018-03-26 NOTE — Addendum Note (Signed)
Addended by: Ezekiel Ina on: 03/26/2018 05:48 PM   Modules accepted: Orders

## 2018-03-26 NOTE — Therapy (Signed)
[facility] MAIN [facility] SERVICES [address] [place], [place], [address] Phone: [phone]   Fax:  [phone]  Physical Therapy Evaluation  Patient Details  Name: DARCELLA SHIFFMAN MRN: [id number] Date of Birth: Jun 17, 1986 Referring Provider: Lyndle Herrlich   Encounter Date: 03/26/2018  PT End of Session - 03/26/18 1552    Visit Number  1    Number of Visits  17    Date for PT Re-Evaluation  05/21/18    PT Start Time  0340    PT Stop Time  0430    PT Time Calculation (min)  50 min    Equipment Utilized During Treatment  Other (comment)   right knee brace   Behavior During Therapy  [facility] for tasks assessed/performed       Past Medical History:  Diagnosis Date  . Diabetes mellitus without complication [facility])     Past Surgical History:  Procedure Laterality Date  . APPENDECTOMY    . CESAREAN SECTION      There were no vitals filed for this visit.   Subjective Assessment - 03/26/18 1545    Subjective  Patient had a knee injury begin in July 19th. She was getting out of the car and she felt a pop and feels like her knee went out of place and then went back into place.     Pertinent History  Patient has had her left knee feel like it is dislocating and it happend 1 year ago and then again recently. She  used ice and rest and then went to the MD and x rays were taken and is no antiinflamatory and also gave her a brace to support it.     Currently in Pain?  Yes    Pain Score  2     Pain Location  Knee    Pain Orientation  Right    Pain Descriptors / Indicators  Aching    Aggravating Factors   standing     Pain Relieving Factors  sitting, resting    Effect of Pain on Daily Activities  not stopping her from any activities    Multiple Pain Sites  No         [facility] PT Assessment - 03/26/18 1553      Assessment   Medical Diagnosis  right knee instability    Referring Provider  Cassell Smiles R    Onset Date/Surgical Date  02/08/18     Hand Dominance  Right    Prior Therapy  none      Precautions   Required Braces or Orthoses  --   right knee brace     Restrictions   Weight Bearing Restrictions  No      Balance Screen   Has the patient fallen in the past 6 months  No    Has the patient had a decrease in activity level because of a fear of falling?   No    Is the patient reluctant to leave their home because of a fear of falling?   No      Home Environment   Living Environment  Private residence    Available Help at Discharge  Family    Type of Home  House    Home Access  Stairs to enter    Entrance Stairs-Number of Steps  6    Entrance Stairs-Rails  Right;Left;Can reach both    Home Equipment  None      Prior Function  Level of Independence  Independent    Vocation  Full time employment    Vocation Requirements  standing, bend, sit    Leisure  enjoy her baby        PAIN: Intermittent pain 0 /10 to 7/10  POSTURE: WNL   PROM/AROM:WNL  BLE knees and hips  Patella tracks laterally with left knee extension  STRENGTH:  Graded on a 0-5 scale Muscle Group Left Right                          Hip Flex 5/5 5/5  Hip Abd 5/5 5/5  Hip Add 5/5 5/5  Hip Ext 4/5 4/5      Knee Flex 5/5 5/5  Knee Ext 5/5 5/5  Ankle DF 5/5 5/5  Ankle PF 5/5 5/5   SENSATION: no numbness or tingling    SPECIAL TESTS:  + RLE Patella apprehension    FUNCTIONAL MOBILITY:  Independent transfers and ambulation   GAIT: Patient ambulates with decreased gait speed and left knee brace to support patella   OUTCOME MEASURES: TEST Outcome Interpretation  5 times sit<>stand 26.00sec >60 yo, >15 sec indicates increased risk for falls  10 meter walk test        .94         m/s <1.0 m/s indicates increased risk for falls; limited community ambulator  Timed up and Go      11.23           sec <14 sec indicates increased risk for falls              Treatment:  RLE SLR with ER and intermittent hold 30, 60 deg x 10 x  2  Quad set x 10 sec x 10 RLE  No reports of pain         Objective measurements completed on examination: See above findings.              PT Education - 03/26/18 1550    Education Details  plan of care    Person(s) Educated  Patient    Methods  Explanation    Comprehension  Verbalized understanding;Returned demonstration;Verbal cues required       PT Short Term Goals - 03/26/18 1739      PT SHORT TERM GOAL #1   Title  Patient will be independent in home exercise program to improve strength/mobility for better functional independence with ADLs.    Time  4    Period  Weeks    Status  New    Target Date  04/23/18      PT SHORT TERM GOAL #2   Title   Patient (< 47 years old) will complete five times sit to stand test in < 10 seconds indicating an increased LE strength and improved balance.    Time  4    Period  Weeks    Status  New    Target Date  04/23/18        PT Long Term Goals - 03/26/18 1740      PT LONG TERM GOAL #1   Title  Patient will increase 10 meter walk test to >1.63m/s as to improve gait speed for better community ambulation and to reduce fall risk.    Time  8    Period  Weeks    Status  New    Target Date  05/21/18      PT LONG TERM GOAL #2  Title  Patient will reduce timed up and go to <11 seconds to reduce fall risk and demonstrate improved transfer/gait ability.    Time  8    Period  Weeks    Status  New    Target Date  05/21/18      PT LONG TERM GOAL #3   Title  Patient will report a worst pain of 0/10 on VAS in  right knee           to improve tolerance with ADLs and reduced symptoms with activities.     Time  8    Period  Weeks    Status  New    Target Date  05/21/18      PT LONG TERM GOAL #4   Title  Patient will increase lower extremity functional scale to >65/80 to demonstrate improved functional mobility and increased tolerance with ADLs.     Baseline  63/80    Time  8    Period  Weeks    Status  New    Target  Date  05/21/18             Plan - 03/26/18 1735    Clinical Impression Statement  Patient presents with instabiity of right knee and wearing a knee brace. She has pain that ranges from 0/10 to 7/10 and is intermittent currently 2/10. She has mild strength deficits , WNL ROM to right knee and + patella apprehension test. She has decreased gait speed and decreased 5 x sit to stand test.  She was educated in HEP and will benefit from skilled PT to improve strength and mobility.    Clinical Presentation  Stable    Clinical Decision Making  Low    Rehab Potential  Good    PT Frequency  2x / week    PT Duration  8 weeks    PT Treatment/Interventions  Taping;Passive range of motion;Manual techniques;Patient/family education;Therapeutic exercise;Therapeutic activities;Functional mobility training;Gait training;Moist Heat;Electrical Stimulation;Cryotherapy;Ultrasound    PT Next Visit Plan  HEP progression    PT Home Exercise Plan  SLR with ER, quad set with 10 sec hold    Consulted and Agree with Plan of Care  Patient       Patient will benefit from skilled therapeutic intervention in order to improve the following deficits and impairments:  Abnormal gait, Decreased strength, Pain, Difficulty walking, Decreased mobility, Decreased activity tolerance  Visit Diagnosis: Muscle weakness (generalized)  Acute pain of right knee     Problem List Patient Active Problem List   Diagnosis Date Noted  . Encounter for routine checking of intrauterine contraceptive device 08/23/2015  . Type 1 diabetes mellitus (HCC) 01/23/2007    Ezekiel Ina, PT DPT 03/26/2018, 5:43 PM  [facility] MAIN [facility] SERVICES [address] [place], [place], [address] Phone: [phone]   Fax:  [phone]  Name: SCHYLAR ALLARD MRN: [id number] Date of Birth: Jun 10, 1986

## 2018-03-28 ENCOUNTER — Ambulatory Visit: Payer: No Typology Code available for payment source | Admitting: Physical Therapy

## 2018-04-02 ENCOUNTER — Ambulatory Visit: Payer: No Typology Code available for payment source | Admitting: Physical Therapy

## 2018-04-02 ENCOUNTER — Encounter: Payer: Self-pay | Admitting: Physical Therapy

## 2018-04-02 DIAGNOSIS — M6281 Muscle weakness (generalized): Secondary | ICD-10-CM

## 2018-04-02 DIAGNOSIS — M25561 Pain in right knee: Secondary | ICD-10-CM

## 2018-04-02 NOTE — Therapy (Signed)
[facility] MAIN [facility] SERVICES [address] [place], [place], [address] Phone: [phone]   Fax:  [phone]  Physical Therapy Treatment  Patient Details  Name: Katie Griffin MRN: [id number] Date of Birth: 03-Apr-1986 Referring Provider: Lyndle Herrlich   Encounter Date: 04/02/2018  PT End of Session - 04/02/18 1455    Visit Number  2    Number of Visits  17    Date for PT Re-Evaluation  05/21/18    PT Start Time  0245    PT Stop Time  0330    PT Time Calculation (min)  45 min    Equipment Utilized During Treatment  Other (comment)   right knee brace   Activity Tolerance  Patient tolerated treatment well;Patient limited by fatigue    Behavior During Therapy  [facility] for tasks assessed/performed       Past Medical History:  Diagnosis Date  . Diabetes mellitus without complication [facility])     Past Surgical History:  Procedure Laterality Date  . APPENDECTOMY    . CESAREAN SECTION      There were no vitals filed for this visit.  Subjective Assessment - 04/02/18 1454    Subjective  Patient is doing her HEP and she is having some pain with the SLR interval exercise.     Pertinent History  Patient has had her left knee feel like it is dislocating and it happend 1 year ago and then again recently. She  used ice and rest and then went to the MD and x rays were taken and is no antiinflamatory and also gave her a brace to support it.     Currently in Pain?  No/denies    Pain Score  0-No pain    Multiple Pain Sites  No        Treatment: All exercises done without brace: Warm up [employer] fitness x 5 mins L 4 SLR intervals with ER and 2 holds x 15 Bridging x 15 Unable to perform single leg bridge with LLE in  WB position Monster walk backward with GTB x 30 feet sidel lying hip abd x 15 BLE squat and sidestepping 30 feet right and left  Bosu lunge x 10 , very hesitant  lunges x 20 feet , alternating LE x 4 sets  no reports of pain during  treatment except during SLR intervals and pain was abolished as soon as she stopped the exercise.                      PT Education - 04/02/18 1455    Education Details  HEP    Person(s) Educated  Patient    Methods  Explanation;Demonstration;Verbal cues    Comprehension  Verbalized understanding;Returned demonstration       PT Short Term Goals - 03/26/18 1739      PT SHORT TERM GOAL #1   Title  Patient will be independent in home exercise program to improve strength/mobility for better functional independence with ADLs.    Time  4    Period  Weeks    Status  New    Target Date  04/23/18      PT SHORT TERM GOAL #2   Title   Patient (< 32 years old) will complete five times sit to stand test in < 10 seconds indicating an increased LE strength and improved balance.    Time  4    Period  Weeks    Status  New    Target Date  04/23/18        PT Long Term Goals - 03/26/18 1740      PT LONG TERM GOAL #1   Title  Patient will increase 10 meter walk test to >1.63m/s as to improve gait speed for better community ambulation and to reduce fall risk.    Time  8    Period  Weeks    Status  New    Target Date  05/21/18      PT LONG TERM GOAL #2   Title  Patient will reduce timed up and go to <11 seconds to reduce fall risk and demonstrate improved transfer/gait ability.    Time  8    Period  Weeks    Status  New    Target Date  05/21/18      PT LONG TERM GOAL #3   Title  Patient will report a worst pain of 0/10 on VAS in  right knee           to improve tolerance with ADLs and reduced symptoms with activities.     Time  8    Period  Weeks    Status  New    Target Date  05/21/18      PT LONG TERM GOAL #4   Title  Patient will increase lower extremity functional scale to >65/80 to demonstrate improved functional mobility and increased tolerance with ADLs.     Baseline  63/80    Time  8    Period  Weeks    Status  New    Target Date  05/21/18             Plan - 04/02/18 1456    Clinical Impression Statement  Patient demonstrates understanding of HEP with minimal corrections needed. Patient has very slow movements with all mobility, and slow gait speed during PT session and appears hesitant to move quickly.  Patient challenged with moderate intensity exercises  she has mild fatigue and no reports of pain. Her ROM is WNL and strength is 4/5 to right knee with patella tracking  correctly with moderate intensiity and open and closed chain exercises. She will continue to benefit from skilled PT to improve strength and ambuate without brace.     Rehab Potential  Good    PT Frequency  2x / week    PT Duration  8 weeks    PT Treatment/Interventions  Taping;Passive range of motion;Manual techniques;Patient/family education;Therapeutic exercise;Therapeutic activities;Functional mobility training;Gait training;Moist Heat;Electrical Stimulation;Cryotherapy;Ultrasound    PT Next Visit Plan  HEP progression    PT Home Exercise Plan  SLR with ER, quad set with 10 sec hold    Consulted and Agree with Plan of Care  Patient       Patient will benefit from skilled therapeutic intervention in order to improve the following deficits and impairments:  Abnormal gait, Decreased strength, Pain, Difficulty walking, Decreased mobility, Decreased activity tolerance  Visit Diagnosis: Muscle weakness (generalized)  Acute pain of right knee     Problem List Patient Active Problem List   Diagnosis Date Noted  . Encounter for routine checking of intrauterine contraceptive device 08/23/2015  . Type 1 diabetes mellitus (HCC) 01/23/2007    Ezekiel Ina, PT DPT 04/02/2018, 3:42 PM  [facility] MAIN [facility] SERVICES [address] [place], [place], [address] Phone: [phone]   Fax:  [phone]  Name: AYNA OROARK MRN: [id number] Date of Birth:  04/27/1986   

## 2018-04-08 ENCOUNTER — Ambulatory Visit: Payer: No Typology Code available for payment source | Admitting: Physical Therapy

## 2018-04-17 ENCOUNTER — Ambulatory Visit: Payer: No Typology Code available for payment source | Admitting: Physical Therapy

## 2018-04-17 ENCOUNTER — Encounter: Payer: Self-pay | Admitting: Physical Therapy

## 2018-04-17 DIAGNOSIS — M25561 Pain in right knee: Secondary | ICD-10-CM

## 2018-04-17 DIAGNOSIS — M6281 Muscle weakness (generalized): Secondary | ICD-10-CM

## 2018-04-17 NOTE — Therapy (Addendum)
[facility] MAIN [facility] SERVICES [address] [place], [place], [address] Phone: [phone]   Fax:  [phone]  Physical Therapy Treatment  Patient Details  Name: Katie Griffin MRN: [id number] Date of Birth: 06-Mar-1986 Referring Provider: Lyndle Herrlich   Encounter Date: 04/17/2018  PT End of Session - 04/17/18 1608    Visit Number  3    Number of Visits  17    Date for PT Re-Evaluation  05/21/18    PT Start Time  1600    PT Stop Time  1645    PT Time Calculation (min)  45 min    Equipment Utilized During Treatment  --    Activity Tolerance  Patient tolerated treatment well    Behavior During Therapy  [facility] for tasks assessed/performed       Past Medical History:  Diagnosis Date  . Diabetes mellitus without complication [facility])     Past Surgical History:  Procedure Laterality Date  . APPENDECTOMY    . CESAREAN SECTION      There were no vitals filed for this visit.  Subjective Assessment - 04/17/18 1606    Subjective  Patient is doing well today; reports doing her HEP and no longer having pain with it. Pt states yesterday her pain was increased at 6/10 following standing all day at work; 3/10 on Monday following a day at work as well.     Pertinent History  Patient has had her left knee feel like it is dislocating and it happend 1 year ago and then again recently. She  used ice and rest and then went to the MD and x rays were taken and is no antiinflamatory and also gave her a brace to support it.     Currently in Pain?  No/denies       Treatment No brace worn today during treatment; pt now only wears with prolonged standing at work   Warm up on [employer] fitness x5 min at level 2    Supine therex, 1.5# ankle weight on RLE: -SLR with ER 2x10  -Bridging with hip adduction ball squeezes x15 reps  Sidelying therex, 1.5# ankle weight on RLE:  -Hip abduction 2x15 reps  Standing therex: -Monster walk forwards/backwards with green  tband x30 feet x2 reps -Crab walks, squat and side stepping, x30 feet left and right x2 reps -Eccentric step downs with RLE on step 2x10 reps, 3-count down and 3-count back up   Standing: -Quad stretch, single UE support, x20 sec hold on R -Hamstring stretch, foot up on step and leaning forwards, RUE support, x20 sec hold  Pt required VCs for proper technique and sequencing throughout session; no reports of pain during treatment with any exercise        PT Education - 04/17/18 1607    Education Details  exercise technique/form, HEP    Person(s) Educated  Patient    Methods  Explanation;Demonstration;Verbal cues    Comprehension  Verbalized understanding;Returned demonstration;Verbal cues required;Need further instruction       PT Short Term Goals - 03/26/18 1739      PT SHORT TERM GOAL #1   Title  Patient will be independent in home exercise program to improve strength/mobility for better functional independence with ADLs.    Time  4    Period  Weeks    Status  New    Target Date  04/23/18      PT SHORT TERM GOAL #2   Title  Patient (< 32 years old) will complete five times sit to stand test in < 10 seconds indicating an increased LE strength and improved balance.    Time  4    Period  Weeks    Status  New    Target Date  04/23/18        PT Long Term Goals - 03/26/18 1740      PT LONG TERM GOAL #1   Title  Patient will increase 10 meter walk test to >1.2569m/s as to improve gait speed for better community ambulation and to reduce fall risk.    Time  8    Period  Weeks    Status  New    Target Date  05/21/18      PT LONG TERM GOAL #2   Title  Patient will reduce timed up and go to <11 seconds to reduce fall risk and demonstrate improved transfer/gait ability.    Time  8    Period  Weeks    Status  New    Target Date  05/21/18      PT LONG TERM GOAL #3   Title  Patient will report a worst pain of 0/10 on VAS in  right knee           to improve tolerance with  ADLs and reduced symptoms with activities.     Time  8    Period  Weeks    Status  New    Target Date  05/21/18      PT LONG TERM GOAL #4   Title  Patient will increase lower extremity functional scale to >65/80 to demonstrate improved functional mobility and increased tolerance with ADLs.     Baseline  63/80    Time  8    Period  Weeks    Status  New    Target Date  05/21/18            Plan - 04/17/18 1622    Clinical Impression Statement  Patient continues to demonstrate understanding of HEP with no complaints of pain when performing at home. Pt continues to move with decreased gait speed and slower mobility overall. Pt challenged with standing therex, specifically eccentric step downs but no reports of pain during exercise. Pt did not wear brace during therapy session and only wears it with prolonged standing at work. She will continue to benefit from skilled PT to improve strength and stability in knee and increase standing tolerance to avoid increases in pain at work.     Rehab Potential  Good    PT Frequency  2x / week    PT Duration  8 weeks    PT Treatment/Interventions  Taping;Passive range of motion;Manual techniques;Patient/family education;Therapeutic exercise;Therapeutic activities;Functional mobility training;Gait training;Moist Heat;Electrical Stimulation;Cryotherapy;Ultrasound    PT Next Visit Plan  HEP progression    PT Home Exercise Plan  SLR with ER, quad set with 10 sec hold    Consulted and Agree with Plan of Care  Patient       Patient will benefit from skilled therapeutic intervention in order to improve the following deficits and impairments:  Abnormal gait, Decreased strength, Pain, Difficulty walking, Decreased mobility, Decreased activity tolerance  Visit Diagnosis: Muscle weakness (generalized)  Acute pain of right knee     Problem List Patient Active Problem List   Diagnosis Date Noted  . Encounter for routine checking of intrauterine  contraceptive device 08/23/2015  . Type 1 diabetes mellitus (HCC) 01/23/2007  Dossie Arbour, SPT This entire session was performed under direct supervision and direction of a licensed therapist/therapist assistant . I have personally read, edited and approve of the note as written.   Trotter,Margaret PT, DPT 04/18/2018, 11:08 AM  [facility] MAIN [facility] SERVICES [address] [place], [place], [address] Phone: [phone]   Fax:  [phone]  Name: SHANTARA GOOSBY MRN: [id number] Date of Birth: Oct 16, 1985

## 2018-04-23 ENCOUNTER — Ambulatory Visit: Payer: No Typology Code available for payment source | Admitting: Physical Therapy

## 2018-04-25 ENCOUNTER — Encounter: Payer: Self-pay | Admitting: Physical Therapy

## 2018-04-25 ENCOUNTER — Ambulatory Visit: Payer: No Typology Code available for payment source | Attending: Orthopedic Surgery | Admitting: Physical Therapy

## 2018-04-25 DIAGNOSIS — M25561 Pain in right knee: Secondary | ICD-10-CM | POA: Insufficient documentation

## 2018-04-25 DIAGNOSIS — M6281 Muscle weakness (generalized): Secondary | ICD-10-CM | POA: Insufficient documentation

## 2018-04-25 NOTE — Therapy (Signed)
[facility] MAIN [facility] SERVICES [address] [place], [place], [address] Phone: [phone]   Fax:  [phone]  Physical Therapy Treatment  Patient Details  Name: Katie Griffin MRN: [id number] Date of Birth: [birth year] Referring Provider (PT): Lyndle Herrlich   Encounter Date: 04/25/2018  PT End of Session - 04/25/18 1525    Visit Number  4    Number of Visits  17    Date for PT Re-Evaluation  05/21/18    PT Start Time  0330    PT Stop Time  0415    PT Time Calculation (min)  45 min    Activity Tolerance  Patient tolerated treatment well    Behavior During Therapy  [facility] for tasks assessed/performed       Past Medical History:  Diagnosis Date  . Diabetes mellitus without complication [facility])     Past Surgical History:  Procedure Laterality Date  . APPENDECTOMY    . CESAREAN SECTION      There were no vitals filed for this visit.  Subjective Assessment - 04/25/18 1531    Subjective  Patient is doing well today; reports doing her HEP and no longer having pain with it. Pt states that she is able to work all day without pain.     Pertinent History  Patient has had her left knee feel like it is dislocating and it happend 1 year ago and then again recently. She  used ice and rest and then went to the MD and x rays were taken and is no antiinflamatory and also gave her a brace to support it.     Currently in Pain?  No/denies    Pain Score  0-No pain         Treatment No brace worn today during treatment; pt now only wears with prolonged standing at work  Warm up on [employer] fitness x5 min at level 2    Supine therex, 2.0 # ankle weight on RLE: SLR with ER and intervals  2x10  Bridging with hip adduction ball squeezes x15 reps Sidelying hip abd ,2 # ankle weight on RLE: 2x15 reps   Standing therex: Squats on BOSU ball x 15 , cues for technique Monster walk forwards/backwards with green tband x30 feet x2 reps lunge walks, squat and  side stepping, x30 feet left and right x2 reps Eccentric step downs with RLE on step 2x10 reps, 3-count down and 3-count back up   Standing: Quad stretch, single UE support, x20 sec hold on R Hamstring stretch, foot up on step and leaning forwards, RUE support, x20 sec hold  Pt required VCs for proper technique and sequencing throughout session; no reports of pain during treatment with any exercise                       PT Education - 04/25/18 1533    Education Details  HEP    Person(s) Educated  Patient    Methods  Explanation;Demonstration;Verbal cues    Comprehension  Verbalized understanding;Returned demonstration;Need further instruction       PT Short Term Goals - 03/26/18 1739      PT SHORT TERM GOAL #1   Title  Patient will be independent in home exercise program to improve strength/mobility for better functional independence with ADLs.    Time  4    Period  Weeks    Status  New    Target Date  04/23/18  PT SHORT TERM GOAL #2   Title   Patient (< 58 years old) will complete five times sit to stand test in < 10 seconds indicating an increased LE strength and improved balance.    Time  4    Period  Weeks    Status  New    Target Date  04/23/18        PT Long Term Goals - 03/26/18 1740      PT LONG TERM GOAL #1   Title  Patient will increase 10 meter walk test to >1.35m/s as to improve gait speed for better community ambulation and to reduce fall risk.    Time  8    Period  Weeks    Status  New    Target Date  05/21/18      PT LONG TERM GOAL #2   Title  Patient will reduce timed up and go to <11 seconds to reduce fall risk and demonstrate improved transfer/gait ability.    Time  8    Period  Weeks    Status  New    Target Date  05/21/18      PT LONG TERM GOAL #3   Title  Patient will report a worst pain of 0/10 on VAS in  right knee           to improve tolerance with ADLs and reduced symptoms with activities.     Time  8    Period   Weeks    Status  New    Target Date  05/21/18      PT LONG TERM GOAL #4   Title  Patient will increase lower extremity functional scale to >65/80 to demonstrate improved functional mobility and increased tolerance with ADLs.     Baseline  63/80    Time  8    Period  Weeks    Status  New    Target Date  05/21/18            Plan - 04/25/18 1535    Clinical Impression Statement  Continuous verbal cues and tactile cues needed to correct form with exercises and for posture correction. Patient required min verbal cues to perform  right knee exercises for posture and control and required verbal and tactile cues during all activities.  Patient is able to perform strengthening exercises with no reports of pain. Patient will benefit from further skilled therapy to return to prior level of function.    Rehab Potential  Good    PT Frequency  2x / week    PT Duration  8 weeks    PT Treatment/Interventions  Taping;Passive range of motion;Manual techniques;Patient/family education;Therapeutic exercise;Therapeutic activities;Functional mobility training;Gait training;Moist Heat;Electrical Stimulation;Cryotherapy;Ultrasound    PT Next Visit Plan  HEP progression    PT Home Exercise Plan  SLR with ER, quad set with 10 sec hold    Consulted and Agree with Plan of Care  Patient       Patient will benefit from skilled therapeutic intervention in order to improve the following deficits and impairments:  Abnormal gait, Decreased strength, Pain, Difficulty walking, Decreased mobility, Decreased activity tolerance  Visit Diagnosis: Muscle weakness (generalized)  Acute pain of right knee     Problem List Patient Active Problem List   Diagnosis Date Noted  . Encounter for routine checking of intrauterine contraceptive device 08/23/2015  . Type 1 diabetes mellitus (HCC) 01/23/2007    Ezekiel Ina, PT DPT 04/25/2018, 3:36 PM  [place]  [facility] MAIN [facility]  SERVICES [address] [place], [place], [address] Phone: [phone]   Fax:  [phone]  Name: Katie Griffin MRN: [id number] Date of Birth: 1985/11/16

## 2018-04-30 ENCOUNTER — Ambulatory Visit: Payer: No Typology Code available for payment source | Admitting: Physical Therapy

## 2018-05-02 ENCOUNTER — Ambulatory Visit: Payer: No Typology Code available for payment source | Admitting: Physical Therapy

## 2018-05-07 ENCOUNTER — Encounter: Payer: Self-pay | Admitting: Physical Therapy

## 2018-05-07 ENCOUNTER — Ambulatory Visit: Payer: No Typology Code available for payment source | Admitting: Physical Therapy

## 2018-05-07 DIAGNOSIS — M6281 Muscle weakness (generalized): Secondary | ICD-10-CM

## 2018-05-07 DIAGNOSIS — M25561 Pain in right knee: Secondary | ICD-10-CM

## 2018-05-07 NOTE — Therapy (Signed)
[facility] MAIN [facility] SERVICES [address] [place], [place], [address] Phone: [phone]   Fax:  [phone]  Physical Therapy Treatment  Patient Details  Name: Katie Griffin MRN: [id number] Date of Birth: [birth year] Referring Provider (PT): Lyndle Herrlich   Encounter Date: 05/07/2018  PT End of Session - 05/07/18 1534    Visit Number  5    Number of Visits  17    Date for PT Re-Evaluation  05/21/18    PT Start Time  0330    PT Stop Time  0410    PT Time Calculation (min)  40 min    Activity Tolerance  Patient tolerated treatment well    Behavior During Therapy  [facility] for tasks assessed/performed       Past Medical History:  Diagnosis Date  . Diabetes mellitus without complication [facility])     Past Surgical History:  Procedure Laterality Date  . APPENDECTOMY    . CESAREAN SECTION      There were no vitals filed for this visit.  Subjective Assessment - 05/07/18 1534    Subjective  Patient is doing well today; reports doing her HEP and no longer having pain with it. Pt states that she is able to work all day without pain. She  was told taht because of the 3 no show appointments she will need to call each week and make one appointment at at time due to our no show policy when patients dont show up for appointments and also dont call to let us know they will not be able to make the appointment,     Pertinent History  Patient has had her left knee feel like it is dislocating and it happend 1 year ago and then again recently. She  used ice and rest and then went to the MD and x rays were taken and is no antiinflamatory and also gave her a brace to support it.     Currently in Pain?  No/denies    Pain Score  0-No pain       Treatment No brace worn today during treatment; pt now only wears with prolonged standing at work  Warm up on [employer] fitness x5 min at level 2    Supine therex, 2 # ankle weight on RLE: -SLR with ER 2x10  Bridging  x 20 Single leg bridge x 10 -Bridging with hip adduction ball squeezes x15 reps Sidelying therex, 2 # ankle weight on RLE:  -Hip abduction 2x15 reps  Standing therex: -Monster walk forwards/backwards with green tband x30 feet x2 reps -Crab walks, squat and side stepping, x30 feet left and right x2 reps -Eccentric step downs with RLE on step 2x10 reps, 3-count down and 3-count back up   Standing: -Quad stretch, single UE support, x20 sec hold on R -Hamstring stretch, foot up on step and leaning forwards, RUE support, x20 sec hold  Pt required VCs for proper technique and sequencing throughout session; no reports of pain during treatment with any exercise                        PT Education - 05/07/18 1534    Education Details  HEP    Person(s) Educated  Patient    Methods  Explanation    Comprehension  Verbalized understanding;Need further instruction       PT Short Term Goals - 03/26/18 1739      PT SHORT TERM GOAL #1  Title  Patient will be independent in home exercise program to improve strength/mobility for better functional independence with ADLs.    Time  4    Period  Weeks    Status  New    Target Date  04/23/18      PT SHORT TERM GOAL #2   Title   Patient (32 years old) will complete five times sit to stand test in < 10 seconds indicating an increased LE strength and improved balance.    Time  4    Period  Weeks    Status  New    Target Date  04/23/18        PT Long Term Goals - 03/26/18 1740      PT LONG TERM GOAL #1   Title  Patient will increase 10 meter walk test to >1.11m/s as to improve gait speed for better community ambulation and to reduce fall risk.    Time  8    Period  Weeks    Status  New    Target Date  05/21/18      PT LONG TERM GOAL #2   Title  Patient will reduce timed up and go to <11 seconds to reduce fall risk and demonstrate improved transfer/gait ability.    Time  8    Period  Weeks    Status  New     Target Date  05/21/18      PT LONG TERM GOAL #3   Title  Patient will report a worst pain of 0/10 on VAS in  right knee           to improve tolerance with ADLs and reduced symptoms with activities.     Time  8    Period  Weeks    Status  New    Target Date  05/21/18      PT LONG TERM GOAL #4   Title  Patient will increase lower extremity functional scale to >65/80 to demonstrate improved functional mobility and increased tolerance with ADLs.     Baseline  63/80    Time  8    Period  Weeks    Status  New    Target Date  05/21/18            Plan - 05/07/18 1539    Clinical Impression Statement  Patient demonstrates LE and core weakness, and impaired mobility and lack of LE strength that is causing difficulty with gait activities.  Patient still fatigues quickly to due very weak quad muscles. Patient is able to perform exercises that were difficult several weeks ago. Patient was informed of our no show policy and asked to make appointments one at a time each week due to her not calling us to let us know she would not be able to attend her therapy session.     Rehab Potential  Good    PT Frequency  2x / week    PT Duration  8 weeks    PT Treatment/Interventions  Taping;Passive range of motion;Manual techniques;Patient/family education;Therapeutic exercise;Therapeutic activities;Functional mobility training;Gait training;Moist Heat;Electrical Stimulation;Cryotherapy;Ultrasound    PT Next Visit Plan  HEP progression    PT Home Exercise Plan  SLR with ER, quad set with 10 sec hold    Consulted and Agree with Plan of Care  Patient       Patient will benefit from skilled therapeutic intervention in order to improve the following deficits and impairments:  Abnormal gait, Decreased strength, Pain, Difficulty  walking, Decreased mobility, Decreased activity tolerance  Visit Diagnosis: Muscle weakness (generalized)  Acute pain of right knee     Problem List Patient Active Problem  List   Diagnosis Date Noted  . Encounter for routine checking of intrauterine contraceptive device 08/23/2015  . Type 1 diabetes mellitus (HCC) 01/23/2007    Ezekiel Ina, PT DPT 05/07/2018, 3:39 PM  [facility] MAIN [facility] SERVICES [address] [place], [place], [address] Phone: [phone]   Fax:  [phone]  Name: CHARLESA EHLE MRN: [id number] Date of Birth: Feb 20, 1986

## 2018-05-09 ENCOUNTER — Ambulatory Visit: Payer: No Typology Code available for payment source | Admitting: Physical Therapy

## 2018-05-16 ENCOUNTER — Ambulatory Visit: Payer: No Typology Code available for payment source | Admitting: Physical Therapy

## 2018-06-26 IMAGING — DX DG ELBOW COMPLETE 3+V*R*
4 series · 4 of 4 positions shown · non-contrast
Comparison: None.

CLINICAL DATA: Acute onset of diffuse right-sided elbow pain.
Status post fall. Initial encounter.

EXAM:
RIGHT ELBOW - COMPLETE 3+ VIEW

[elbow ap]
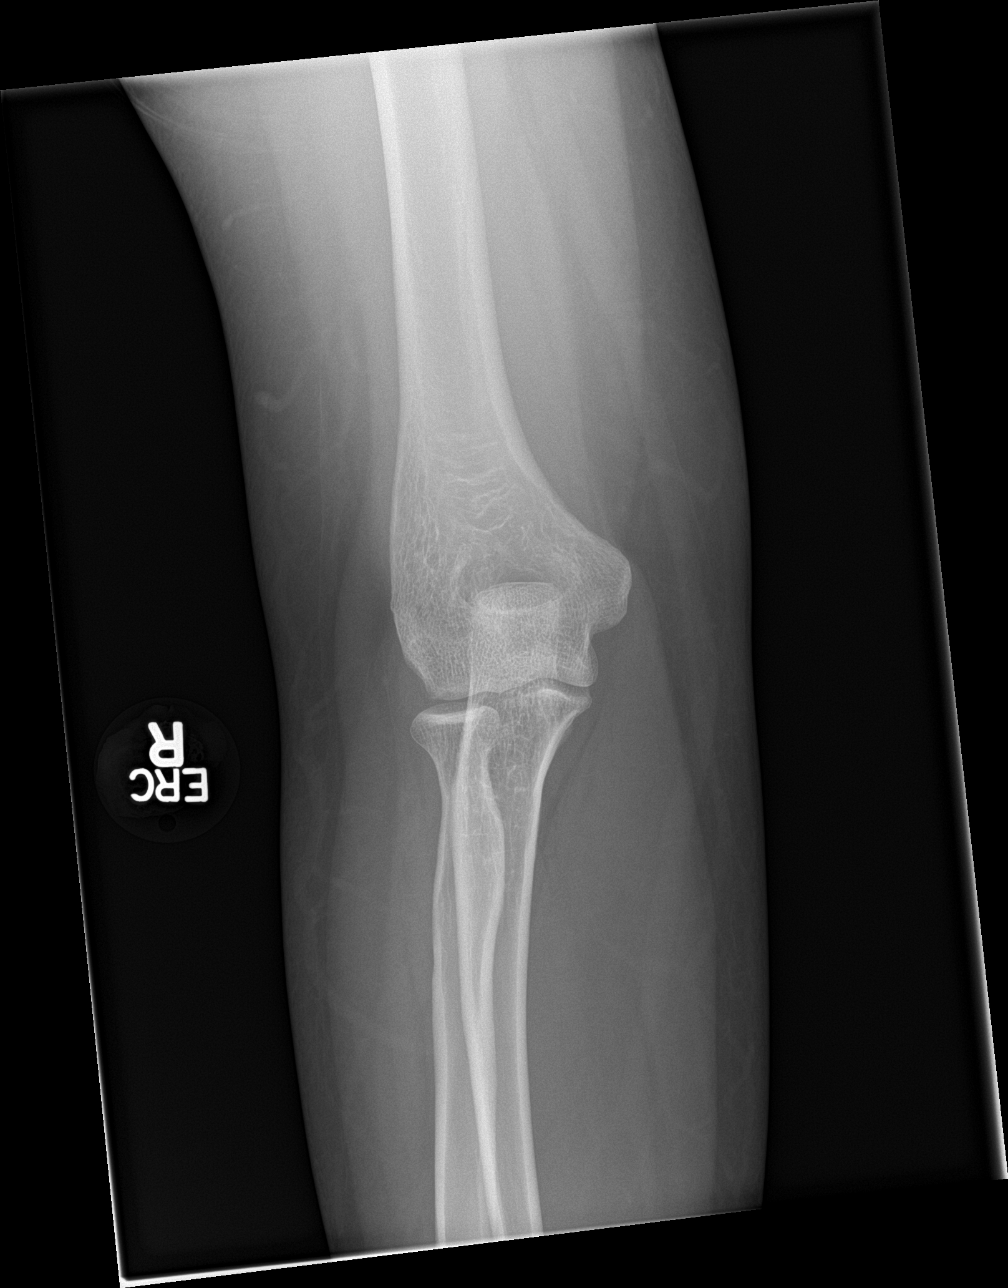

[elbow obl (1 of 2)]
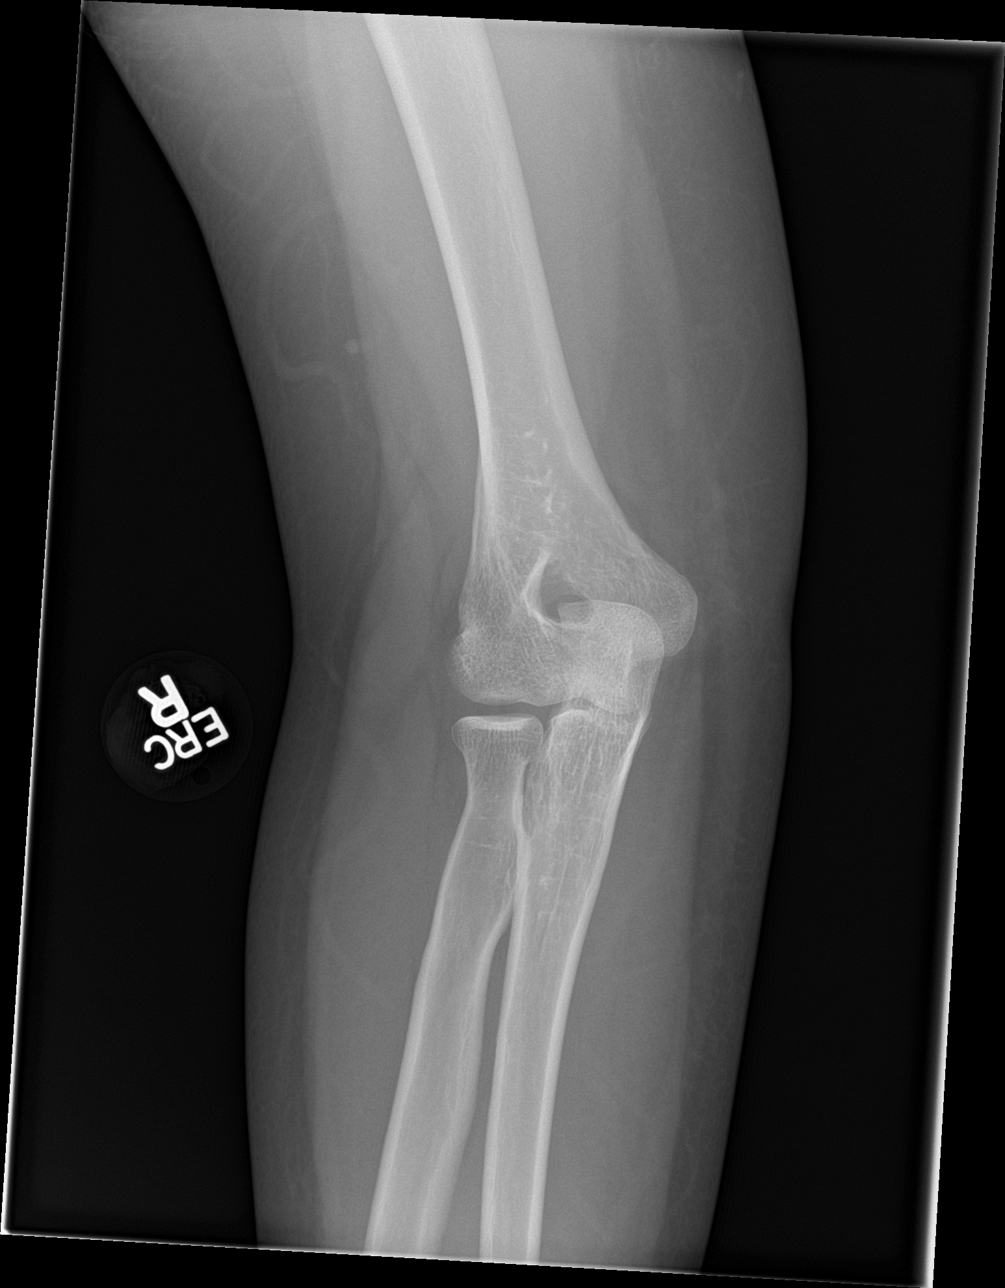

[elbow obl (2 of 2)]
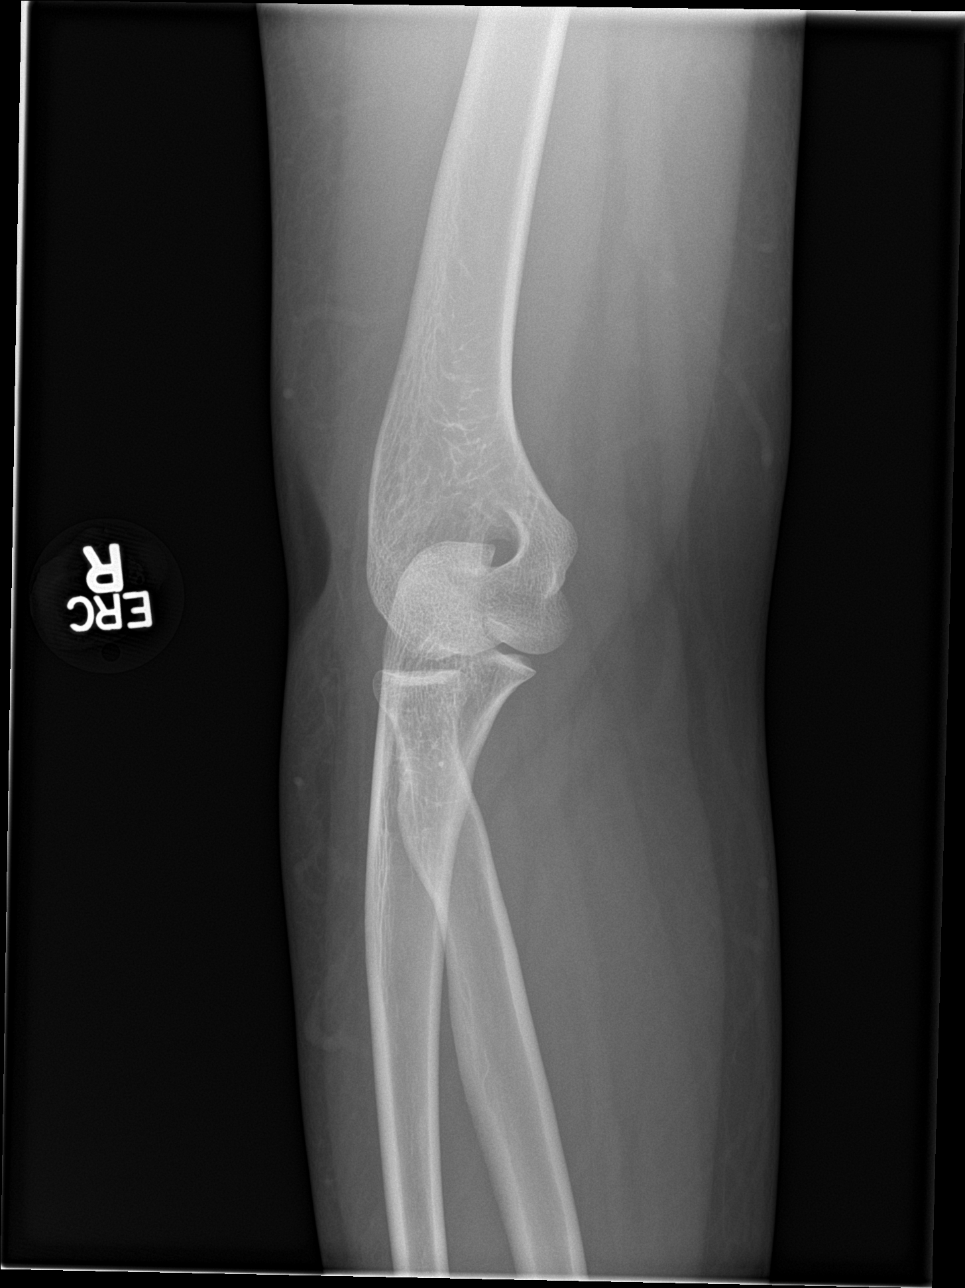

[elbow lat]
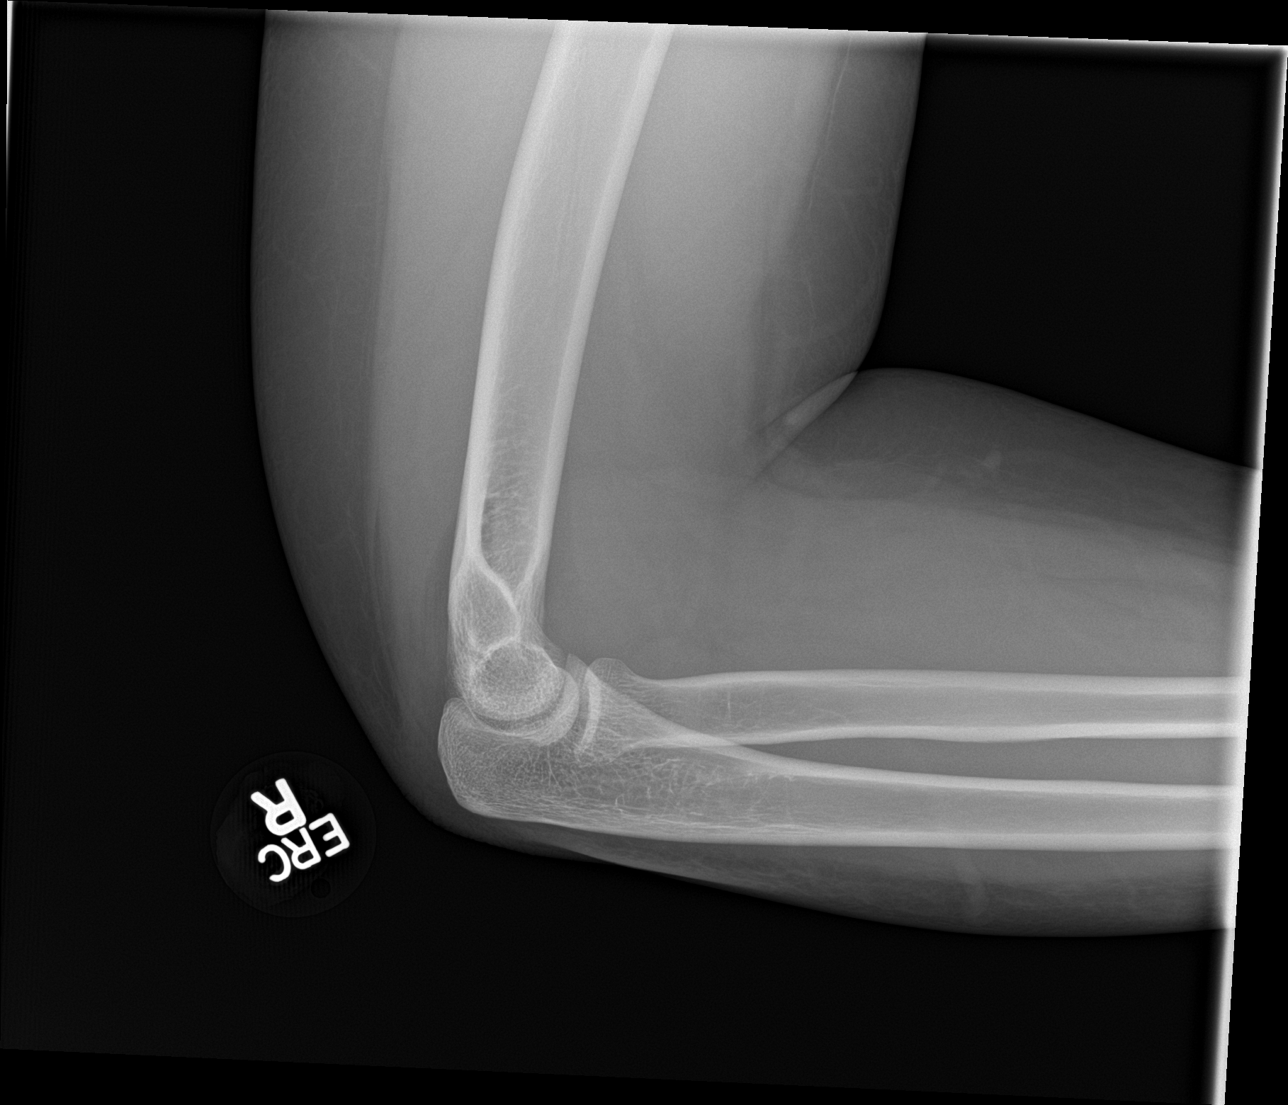

[4 of 4 positions shown; findings below may reference images not displayed]

FINDINGS: There is no evidence of fracture or dislocation. The visualized
joint spaces are preserved. An elbow joint effusion is noted. The
soft tissues are unremarkable in appearance.
IMPRESSION: Elbow joint effusion noted. No definite evidence of fracture, though
an underlying occult fracture cannot be entirely excluded.

## 2018-09-30 ENCOUNTER — Ambulatory Visit: Payer: Self-pay | Admitting: Physician Assistant

## 2018-09-30 VITALS — BP 148/100 | HR 100 | Temp 101.2°F | Resp 16 | Wt 202.0 lb

## 2018-09-30 DIAGNOSIS — R030 Elevated blood-pressure reading, without diagnosis of hypertension: Secondary | ICD-10-CM

## 2018-09-30 DIAGNOSIS — J101 Influenza due to other identified influenza virus with other respiratory manifestations: Secondary | ICD-10-CM

## 2018-09-30 DIAGNOSIS — R05 Cough: Secondary | ICD-10-CM

## 2018-09-30 DIAGNOSIS — R059 Cough, unspecified: Secondary | ICD-10-CM

## 2018-09-30 LAB — POCT INFLUENZA A/B
INFLUENZA A, POC: POSITIVE — AB
INFLUENZA B, POC: NEGATIVE

## 2018-09-30 MED ORDER — OSELTAMIVIR PHOSPHATE 75 MG PO CAPS: 75.0000 mg | ORAL_CAPSULE | Freq: Two times a day (BID) | ORAL | 0 refills | Status: DC

## 2018-09-30 MED ORDER — BENZOCAINE-MENTHOL 15-3.6 MG MT LOZG: 1.0000 | LOZENGE | OROMUCOSAL | 0 refills | Status: DC

## 2018-09-30 MED ORDER — BENZONATATE 100 MG PO CAPS: 100.0000 mg | ORAL_CAPSULE | Freq: Three times a day (TID) | ORAL | 0 refills | Status: DC | PRN

## 2018-09-30 NOTE — Patient Instructions (Addendum)
Influenza, Adult  Youhave tested positive for the flu. Take tamiflu asprescribed. Use Tessalon Perles as needed for cough during day. May use over the counter coricidin cough syrup as well. Use over the counter ibuprofen or tylenol as prescribed as needed for fever and general discomfort. Two major complications after the flu are pneumonia and sinus infections. Please be aware of this and if you are not any better at all in a few days please go to urgent care. if symptoms do not fully resolve after 7 days or you develop worsening cough or sinus pressure, seek care aturgent careor the ED. Continue to wash your hands and wear a mask daily especially around other people.  Influenza is also called "the flu." It is an infection in the lungs, nose, and throat (respiratory tract). It is caused by a virus. The flu causes symptoms that are similar to symptoms of a cold. It also causes a high fever and body aches. The flu spreads easily from person to person (is contagious). Getting a flu shot (influenza vaccination) every year is the best way to prevent the flu. What are the causes? This condition is caused by the influenza virus. You can get the virus by:  Breathing in droplets that are in the air from the cough or sneeze of a person who has the virus.  Touching something that has the virus on it (is contaminated) and then touching your mouth, nose, or eyes. What increases the risk? Certain things may make you more likely to get the flu. These include:  Not washing your hands often.  Having close contact with many people during cold and flu season.  Touching your mouth, eyes, or nose without first washing your hands.  Not getting a flu shot every year. You may have a higher risk for the flu, along with serious problems such as a lung infection (pneumonia), if you:  Are older than 65.  Are pregnant.  Have a weakened disease-fighting system (immune system) because of a disease or  taking certain medicines.  Have a long-term (chronic) illness, such as: ? Heart, kidney, or lung disease. ? Diabetes. ? Asthma.  Have a liver disorder.  Are very overweight (morbidly obese).  Have anemia. This is a condition that affects your red blood cells. What are the signs or symptoms? Symptoms usually begin suddenly and last 4-14 days. They may include:  Fever and chills.  Headaches, body aches, or muscle aches.  Sore throat.  Cough.  Runny or stuffy (congested) nose.  Chest discomfort.  Not wanting to eat as much as normal (poor appetite).  Weakness or feeling tired (fatigue).  Dizziness.  Feeling sick to your stomach (nauseous) or throwing up (vomiting). How is this treated? If the flu is found early, you can be treated with medicine that can help reduce how bad the illness is and how long it lasts (antiviral medicine). This may be given by mouth (orally) or through an IV tube. Taking care of yourself at home can help your symptoms get better. Your doctor may suggest:  Taking over-the-counter medicines.  Drinking plenty of fluids. The flu often goes away on its own. If you have very bad symptoms or other problems, you may be treated in a hospital. Follow these instructions at home:     Activity  Rest as needed. Get plenty of sleep.  Stay home from work or school as told by your doctor. ? Do not leave home until you do not have a fever for 24  hours without taking medicine. ? Leave home only to visit your doctor. Eating and drinking  Take an ORS (oral rehydration solution). This is a drink that is sold at pharmacies and stores.  Drink enough fluid to keep your pee (urine) pale yellow.  Drink clear fluids in small amounts as you are able. Clear fluids include: ? Water. ? Ice chips. ? Fruit juice that has water added (diluted fruit juice). ? Low-calorie sports drinks.  Eat bland, easy-to-digest foods in small amounts as you are able. These foods  include: ? Bananas. ? Applesauce. ? Rice. ? Lean meats. ? Toast. ? Crackers.  Do not eat or drink: ? Fluids that have a lot of sugar or caffeine. ? Alcohol. ? Spicy or fatty foods. General instructions  Take over-the-counter and prescription medicines only as told by your doctor.  Use a cool mist humidifier to add moisture to the air in your home. This can make it easier for you to breathe.  Cover your mouth and nose when you cough or sneeze.  Wash your hands with soap and water often, especially after you cough or sneeze. If you cannot use soap and water, use alcohol-based hand sanitizer.  Keep all follow-up visits as told by your doctor. This is important. How is this prevented?   Get a flu shot every year. You may get the flu shot in late summer, fall, or winter. Ask your doctor when you should get your flu shot.  Avoid contact with people who are sick during fall and winter (cold and flu season). Contact a doctor if:  You get new symptoms.  You have: ? Chest pain. ? Watery poop (diarrhea). ? A fever.  Your cough gets worse.  You start to have more mucus.  You feel sick to your stomach.  You throw up. Get help right away if you:  Have shortness of breath.  Have trouble breathing.  Have skin or nails that turn a bluish color.  Have very bad pain or stiffness in your neck.  Get a sudden headache.  Get sudden pain in your face or ear.  Cannot eat or drink without throwing up. Summary  Influenza ("the flu") is an infection in the lungs, nose, and throat. It is caused by a virus.  Take over-the-counter and prescription medicines only as told by your doctor.  Getting a flu shot every year is the best way to avoid getting the flu. This information is not intended to replace advice given to you by your health care provider. Make sure you discuss any questions you have with your health care provider. Document Released: 04/18/2008 Document Revised:  12/26/2017 Document Reviewed: 12/26/2017 Elsevier Interactive Patient Education  2019 [employer].

## 2018-09-30 NOTE — Progress Notes (Signed)
MRN: [id number] DOB: 1986-02-24  Subjective:   Katie Griffin is a 33 y.o. female presenting for chief complaint of scratchy throat, cough, and chest congestion x 2 days. Woke up this morning and felt way worse. Had fever, chills, and body aches. Cough is productive. Has also noticed some SOB and chest tightness with coughing. Denies heart palpitations, SOB at rest, DOE, lower leg swelling,  sinus pain, ear drainage, inability to swallow and voice change, nausea, vomiting, abdominal pain and diarrhea. Has not tried anything for relief. No known sick contact exposure. Has PMH of T1DM:reports last A1C was 6.9 but sugars were elevated at 380- took her insulin and it improved; HTN,HTN, and seasonal allergies. Denies history of asthma. Patient has had flu shot this season. Denies smoking. Denies any other aggravating or relieving factors, no other questions or concerns.  Review of Systems  Constitutional: Positive for malaise/fatigue. Negative for diaphoresis.  Eyes: Negative for blurred vision and double vision.  Respiratory: Negative for hemoptysis.   Cardiovascular: Negative for leg swelling.  Genitourinary: Negative for dysuria, frequency and urgency.  Skin: Negative for rash.  Neurological: Negative for dizziness and headaches.  Endo/Heme/Allergies: Negative for polydipsia.    Katie Griffin has a current medication list which includes the following prescription(s): amlodipine, glucose blood, insulin lispro, insulin pen needle, insulin syringe .5cc/28g, lantus solostar, levonorgestrel, metformin, atorvastatin, benzocaine-menthol, benzonatate, cefdinir, hydrochlorothiazide, ipratropium, levocetirizine, meloxicam, and oseltamivir. Also has No Known Allergies.  Katie Griffin  has a past medical history of Diabetes mellitus without complication (HCC). Also  has a past surgical history that includes Appendectomy and Cesarean section.   Objective:   Vitals: BP (!) 148/100 (BP Location: Right Arm, Patient Position:  Sitting, Cuff Size: Normal)   Pulse 100   Temp (!) 101.2 F (38.4 C)   Resp 16   Wt 202 lb (91.6 kg)   SpO2 98%   BMI 38.17 kg/m   Physical Exam Vitals signs reviewed.  Constitutional:      General: She is not in acute distress.    Appearance: She is well-developed. She is not ill-appearing or toxic-appearing.  HENT:     Head: Normocephalic and atraumatic.     Right Ear: Tympanic membrane, ear canal and external ear normal.     Left Ear: Tympanic membrane, ear canal and external ear normal.     Nose: Nose normal. No congestion or rhinorrhea.     Right Sinus: No maxillary sinus tenderness or frontal sinus tenderness.     Left Sinus: No maxillary sinus tenderness or frontal sinus tenderness.     Mouth/Throat:     Lips: Pink.     Mouth: Mucous membranes are moist.     Pharynx: Uvula midline. Posterior oropharyngeal erythema present.     Tonsils: No tonsillar exudate or tonsillar abscesses. Swelling: 1+ on the right. 1+ on the left.  Eyes:     Conjunctiva/sclera: Conjunctivae normal.  Neck:     Musculoskeletal: Normal range of motion.  Cardiovascular:     Rate and Rhythm: Normal rate and regular rhythm.     Heart sounds: Normal heart sounds.  Pulmonary:     Effort: Pulmonary effort is normal. Bradypnea present.     Breath sounds: Normal breath sounds. No decreased breath sounds, wheezing, rhonchi or rales.     Comments: Difficult lung exam due to large body habitus.  Lymphadenopathy:     Head:     Right side of head: No submental, submandibular, tonsillar, preauricular, posterior auricular or occipital adenopathy.  Left side of head: No submental, submandibular, tonsillar, preauricular, posterior auricular or occipital adenopathy.     Cervical: No cervical adenopathy.     Upper Body:     Right upper body: No supraclavicular adenopathy.     Left upper body: No supraclavicular adenopathy.  Skin:    General: Skin is warm and dry.  Neurological:     Mental Status: She is  alert.     Results for orders placed or performed in visit on 09/30/18 (from the past 24 hour(s))  POCT Influenza A/B     Status: Abnormal   Collection Time: 09/30/18  3:17 PM  Result Value Ref Range   Influenza A, POC Positive (A) Negative   Influenza B, POC Negative Negative     BP Readings from Last 3 Encounters:  09/30/18 (!) 148/100  03/06/18 (!) 140/110  11/20/17 130/90    Assessment and Plan :  1. Influenza A POCT influenza test positive for influenza A.  Debilitated 101.2.  Overall well appearing, NAD.  Discussed natural history of the disease and treatment options; including supportive care and antiviral therapy.  Due to her comorbidities, she would benefit from antiviral tx. Provided Rx for tamilfu. Encouraged rest, hydration, and to continue OTC tylenol or ibuprofen as prescribed for fever. 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 Recommended wearing a mask daily especially around other people. Remain out of work until afebrile for 48 hours w/o use of antipyretics. Educated on potential complications of the flu. Advised to follow up in clinic or with family doctor, local urgent care, or ED if develops any of these concerning symptoms or if current symptoms persist outside of discussed boundaries or if current worsen over the next few days. She voices her understanding. - oseltamivir (TAMIFLU) 75 MG capsule; Take 1 capsule (75 mg total) by mouth 2 (two) times daily.  Dispense: 10 capsule; Refill: 0 - benzonatate (TESSALON) 100 MG capsule; Take 1-2 capsules (100-200 mg total) by mouth 3 (three) times daily as needed for cough.  Dispense: 40 capsule; Refill: 0 - Benzocaine-Menthol (CEPACOL SORE THROAT) 15-3.6 MG LOZG; Use as directed 1 lozenge in the mouth or throat every 4 (four) hours.  Dispense: 18 each; Refill: 0  2. Cough - POCT Influenza A/B  3. Elevated blood pressure reading In terms of bp, she is asymptomatic. Instructed to check bp outside of office over the next couple of  weeks. Schedule appointment with PCP if consistently >140/90. Given strict ED precautions.    Benjiman Core, Cordelia Poche  [facility] 09/30/2018 4:56 PM

## 2018-10-03 ENCOUNTER — Telehealth: Payer: Self-pay | Admitting: Emergency Medicine

## 2018-10-03 NOTE — Telephone Encounter (Signed)
Spoke with patient and informed her of recommendations she acknowledge understanding

## 2018-10-03 NOTE — Telephone Encounter (Signed)
Spoke with patient whom informed me that her coughing is getting worse informed patient I will speak with provider here to see if there is anything else she should take for the cough. Then patient proceeded to inform me that she still has a fever even though she has been taking Ibu. and tylenol  She did state that she has reached out to her PCP today

## 2018-10-03 NOTE — Telephone Encounter (Signed)
For the cough, over-the-counter Coricidin-HBP might work well, will not raise blood pressure. Using a cool mist humidifier in the bedroom. Propping self up with several pillows or sleeping in a recliner, can sometimes lessen cough.  Tylenol or ibuprofen for the fever is good. If fever is persisting and cough isn't improving, I would recommend patient follow-up with her family physician or at the urgent care. Could simply be lingering flu, but sometimes you will flu complications or secondary infections. Patient's family physician may obtain a CXR, or blood work, or prescribe new medication for the cough.  Hope she feels better soon! Thanks SFS PA-C

## 2019-03-11 DIAGNOSIS — H5213 Myopia, bilateral: Secondary | ICD-10-CM | POA: Diagnosis not present

## 2019-05-30 DIAGNOSIS — E103299 Type 1 diabetes mellitus with mild nonproliferative diabetic retinopathy without macular edema, unspecified eye: Secondary | ICD-10-CM | POA: Diagnosis not present

## 2019-06-13 DIAGNOSIS — E103299 Type 1 diabetes mellitus with mild nonproliferative diabetic retinopathy without macular edema, unspecified eye: Secondary | ICD-10-CM | POA: Diagnosis not present

## 2019-06-13 DIAGNOSIS — E108 Type 1 diabetes mellitus with unspecified complications: Secondary | ICD-10-CM | POA: Diagnosis not present

## 2019-07-02 DIAGNOSIS — E1029 Type 1 diabetes mellitus with other diabetic kidney complication: Secondary | ICD-10-CM | POA: Diagnosis not present

## 2019-07-17 DIAGNOSIS — E108 Type 1 diabetes mellitus with unspecified complications: Secondary | ICD-10-CM | POA: Diagnosis not present

## 2019-08-27 DIAGNOSIS — E1065 Type 1 diabetes mellitus with hyperglycemia: Secondary | ICD-10-CM | POA: Diagnosis not present

## 2019-09-05 DIAGNOSIS — Z9641 Presence of insulin pump (external) (internal): Secondary | ICD-10-CM | POA: Diagnosis not present

## 2019-09-05 DIAGNOSIS — E109 Type 1 diabetes mellitus without complications: Secondary | ICD-10-CM | POA: Diagnosis not present

## 2019-10-21 DIAGNOSIS — E108 Type 1 diabetes mellitus with unspecified complications: Secondary | ICD-10-CM | POA: Diagnosis not present

## 2019-10-31 DIAGNOSIS — E1029 Type 1 diabetes mellitus with other diabetic kidney complication: Secondary | ICD-10-CM | POA: Diagnosis not present

## 2019-11-12 ENCOUNTER — Other Ambulatory Visit: Payer: Self-pay

## 2019-11-12 ENCOUNTER — Emergency Department
Admission: EM | Admit: 2019-11-12 | Discharge: 2019-11-12 | Disposition: A | Discharge status: Home / Self Care | Payer: 59 | Attending: Emergency Medicine | Admitting: Emergency Medicine

## 2019-11-12 ENCOUNTER — Encounter: Payer: Self-pay | Admitting: Emergency Medicine

## 2019-11-12 DIAGNOSIS — R739 Hyperglycemia, unspecified: Secondary | ICD-10-CM

## 2019-11-12 DIAGNOSIS — Z79899 Other long term (current) drug therapy: Secondary | ICD-10-CM | POA: Diagnosis not present

## 2019-11-12 DIAGNOSIS — E1065 Type 1 diabetes mellitus with hyperglycemia: Secondary | ICD-10-CM | POA: Diagnosis not present

## 2019-11-12 DIAGNOSIS — E1165 Type 2 diabetes mellitus with hyperglycemia: Secondary | ICD-10-CM | POA: Diagnosis not present

## 2019-11-12 DIAGNOSIS — Z794 Long term (current) use of insulin: Secondary | ICD-10-CM | POA: Insufficient documentation

## 2019-11-12 LAB — BASIC METABOLIC PANEL
Anion gap: 7 (ref 5–15)
BUN: 12 mg/dL (ref 6–20)
CO2: 24 mmol/L (ref 22–32)
Calcium: 8.8 mg/dL — ABNORMAL LOW (ref 8.9–10.3)
Chloride: 103 mmol/L (ref 98–111)
Creatinine, Ser: 0.72 mg/dL (ref 0.44–1.00)
GFR calc Af Amer: >60 mL/min (ref >60)
GFR calc non Af Amer: >60 mL/min (ref >60)
Glucose, Bld: 294 mg/dL — ABNORMAL HIGH (ref 70–99)
Potassium: 4.7 mmol/L (ref 3.5–5.1)
Sodium: 134 mmol/L — ABNORMAL LOW (ref 135–145)

## 2019-11-12 LAB — URINALYSIS, COMPLETE (UACMP) WITH MICROSCOPIC
Bilirubin Urine: NEGATIVE
Glucose, UA: >=500 mg/dL — AB
Hgb urine dipstick: NEGATIVE
Ketones, ur: NEGATIVE mg/dL
Leukocytes,Ua: NEGATIVE
Nitrite: NEGATIVE
Protein, ur: NEGATIVE mg/dL
Specific Gravity, Urine: 1.027 (ref 1.005–1.030)
pH: 6 (ref 5.0–8.0)

## 2019-11-12 LAB — CBC
HCT: 35.4 % — ABNORMAL LOW (ref 36.0–46.0)
Hemoglobin: 12.2 g/dL (ref 12.0–15.0)
MCH: 31.4 pg (ref 26.0–34.0)
MCHC: 34.5 g/dL (ref 30.0–36.0)
MCV: 91 fL (ref 80.0–100.0)
Platelets: 404 10*3/uL — ABNORMAL HIGH (ref 150–400)
RBC: 3.89 MIL/uL (ref 3.87–5.11)
RDW: 11.8 % (ref 11.5–15.5)
WBC: 9.5 10*3/uL (ref 4.0–10.5)
nRBC: 0 % (ref 0.0–0.2)

## 2019-11-12 LAB — GLUCOSE, CAPILLARY: Glucose-Capillary: 260 mg/dL — ABNORMAL HIGH (ref 70–99)

## 2019-11-12 NOTE — ED Triage Notes (Signed)
Pt states that yesterday she moved her insulin pump from her abdomen to her right arm.

## 2019-11-12 NOTE — ED Provider Notes (Signed)
[facility] Emergency Department Provider Note   ____________________________________________    I have reviewed the triage vital signs and the nursing notes.   HISTORY  Chief Complaint Hyperglycemia     HPI Katie Griffin is a 33 y.o. female with a history of type 1 diabetes who presents with concerns of elevated blood sugar.  Patient reports she has an insulin pump which she has had for several months but glucose has been higher than typical over the last several days.  She denies fevers or chills.  No abdominal pain.  No nausea or vomiting.  No diarrhea.  No cough or shortness of breath.  Switch the pump to her arm from her abdomen at the recommendation of her endocrinologist.  However that does not seem to have significantly helped.  When she instructs her pump to give her insulin her glucose does drop but it is running in the 200s and 300s which is atypical for her.   Past Medical History:  Diagnosis Date  . Diabetes mellitus without complication [facility])     Patient Active Problem List   Diagnosis Date Noted  . Encounter for routine checking of intrauterine contraceptive device 08/23/2015  . Type 1 diabetes mellitus (HCC) 01/23/2007    Past Surgical History:  Procedure Laterality Date  . APPENDECTOMY    . CESAREAN SECTION    . insulin pump      Prior to Admission medications   Medication Sig Start Date End Date Taking? Authorizing Provider  amLODipine (NORVASC) 10 MG tablet Take by mouth. 10/12/17 10/12/18  [provider]  atorvastatin (LIPITOR) 10 MG tablet Take by mouth. 03/24/17 03/24/18  [provider]  Benzocaine-Menthol (CEPACOL SORE THROAT) 15-3.6 MG LOZG Use as directed 1 lozenge in the mouth or throat every 4 (four) hours. 09/30/18   Benjiman Core D, PA-C  benzonatate (TESSALON) 100 MG capsule Take 1-2 capsules (100-200 mg total) by mouth 3 (three) times daily as needed for cough. 09/30/18   Benjiman Core D, PA-C    cefdinir (OMNICEF) 300 MG capsule Take 2 capsules (600 mg total) by mouth daily. Patient not taking: Reported on 09/30/2018 03/06/18   Bing Neighbors, FNP  glucose blood (ONE TOUCH ULTRA TEST) test strip  11/04/14   [provider]  hydrochlorothiazide (HYDRODIURIL) 12.5 MG tablet Take by mouth. 04/04/17 04/04/18  [provider]  insulin lispro (HUMALOG KWIKPEN) 100 UNIT/ML KiwkPen Humalog KwikPen (U-100) Insulin 100 unit/mL subcutaneous 08/19/13   [provider]  Insulin Pen Needle (B-D ULTRAFINE III SHORT PEN) 31G X 8 MM MISC  08/02/15   [provider]  INSULIN SYRINGE .5CC/28G (B-D INS SYR MICROFINE .5CC/28G) 28G X 1/2" 0.5 ML MISC  09/09/14   [provider]  ipratropium (ATROVENT) 0.03 % nasal spray Place 2 sprays into both nostrils 2 (two) times daily. Patient not taking: Reported on 03/06/2018 11/20/17   Bing Neighbors, FNP  LANTUS SOLOSTAR 100 UNIT/ML Solostar Pen  01/03/16   [provider]  levocetirizine (XYZAL) 5 MG tablet Take 1 tablet (5 mg total) by mouth every evening. Patient not taking: Reported on 03/06/2018 11/20/17   Bing Neighbors, FNP  levonorgestrel (MIRENA) 20 MCG/24HR IUD by Intrauterine route. 06/04/15   [provider]  meloxicam (MOBIC) 15 MG tablet meloxicam 15 mg tablet  Take 1 tablet every day by oral route with meals.    [provider]  metFORMIN (GLUCOPHAGE) 500 MG tablet Take by mouth. 10/12/17 10/12/18  [provider]  oseltamivir (TAMIFLU) 75 MG capsule Take 1 capsule (75 mg total) by mouth 2 (two) times daily. 09/30/18   Tenna Delaine D, PA-C     Allergies Patient has no known allergies.  Family History  Problem Relation Age of Onset  . Hypertension Mother   . Hypertension Father     Social History Social History   Tobacco Use  . Smoking status: Never Smoker  . Smokeless tobacco: Never Used  Substance Use Topics  . Alcohol use: No  . Drug use: Not on file     Review of Systems  Constitutional: No fever/chills Eyes: No visual changes.  ENT: No sore throat. Cardiovascular: Denies chest pain. Respiratory: Denies shortness of breath. Gastrointestinal: No abdominal pain.  No nausea, no vomiting.   Genitourinary: Negative for dysuria. Musculoskeletal: Negative for back pain. Skin: Negative for rash. Neurological: Negative for headaches   ____________________________________________   PHYSICAL EXAM:  VITAL SIGNS: ED Triage Vitals  Enc Vitals Group     BP 11/12/19 1712 (!) 155/107     Pulse Rate 11/12/19 1712 94     Resp 11/12/19 1712 18     Temp 11/12/19 1712 98.3 F (36.8 C)     Temp Source 11/12/19 1712 Oral     SpO2 11/12/19 1712 99 %     Weight 11/12/19 1713 92.1 kg (203 lb)     Height 11/12/19 1713 1.575 m (5\' 2" )     Head Circumference --      Peak Flow --      Pain Score 11/12/19 1724 0     Pain Loc --      Pain Edu? --      Excl. in [place]? --     Constitutional: Alert and oriented  Nose: No congestion/rhinnorhea. Mouth/Throat: Mucous membranes are moist.    Cardiovascular: Normal rate, regular rhythm. Grossly normal heart sounds.  Good peripheral circulation. Respiratory: Normal respiratory effort.  No retractions. Lungs CTAB. Gastrointestinal: Soft and nontender. No distention.  No CVA tenderness.  Musculoskeletal: No lower extremity tenderness nor edema.  Insulin pump well seated on the right arm, no rash or infection Neurologic:  Normal speech and language. No gross focal neurologic deficits are appreciated.  Skin:  Skin is warm, dry and intact. No rash noted. Psychiatric: Mood and affect are normal. Speech and behavior are normal.  ____________________________________________   LABS (all labs ordered are listed, but only abnormal results are displayed)  Labs Reviewed  GLUCOSE, CAPILLARY - Abnormal; Notable for the following components:      Result Value   Glucose-Capillary 260 (*)    All other  components within normal limits  BASIC METABOLIC PANEL - Abnormal; Notable for the following components:   Sodium 134 (*)    Glucose, Bld 294 (*)    Calcium 8.8 (*)    All other components within normal limits  CBC - Abnormal; Notable for the following components:   HCT 35.4 (*)    Platelets 404 (*)    All other components within normal limits  URINALYSIS, COMPLETE (UACMP) WITH MICROSCOPIC - Abnormal; Notable for the following components:   Color, Urine YELLOW (*)    APPearance CLEAR (*)    Glucose, UA >=500 (*)    Bacteria, UA RARE (*)    All other components within normal limits  CBG MONITORING, ED  CBG MONITORING, ED   ____________________________________________  EKG  None ____________________________________________  RADIOLOGY  None ____________________________________________   PROCEDURES  Procedure(s) performed: No  Procedures   Critical Care performed: No ____________________________________________   INITIAL IMPRESSION / ASSESSMENT AND PLAN / ED COURSE  Pertinent labs & imaging results that were available during my care of the patient were reviewed by me and considered in my medical decision making (see chart for details).  Patient presents with elevated glucose.  Differential includes dehydration, infection, DKA, insulin pump malfunction  Lab work is quite reassuring, anion gap, CO2 are normal.  Patient well-appearing and in no acute distress, does apparently have a history of hypertension based on vitals records.  No tachycardia no hypoxia.  Normal white blood cell count.  Kidney function is normal.  Glucose is 294 on CMP.  I had the patient program a 10 unit bolus into her pump and we watched as her glucose decreased from 350-ish to 275 so the pump does appear to be working but something is certainly changed from her prior levels of control.  No indication for admission at this time, she will follow-up closely with her endocrinologist tomorrow     ____________________________________________   FINAL CLINICAL IMPRESSION(S) / ED DIAGNOSES  Final diagnoses:  Hyperglycemia        Note:  This document was prepared using Dragon voice recognition software and may include unintentional dictation errors.   Jene Every, MD 11/12/19 2016

## 2019-11-12 NOTE — ED Triage Notes (Signed)
Pt reports her blood sugar has been running high at home. Pt reports the highest it has been was 389. Pt reports has an insulin pump and is not sure if it is not working right or something else is going on. Pt reports called her MD and was advised to be evaluated.

## 2019-11-14 DIAGNOSIS — E1029 Type 1 diabetes mellitus with other diabetic kidney complication: Secondary | ICD-10-CM | POA: Diagnosis not present

## 2019-11-21 DIAGNOSIS — E108 Type 1 diabetes mellitus with unspecified complications: Secondary | ICD-10-CM | POA: Diagnosis not present

## 2020-01-22 DIAGNOSIS — E108 Type 1 diabetes mellitus with unspecified complications: Secondary | ICD-10-CM | POA: Diagnosis not present

## 2020-02-04 DIAGNOSIS — E1029 Type 1 diabetes mellitus with other diabetic kidney complication: Secondary | ICD-10-CM | POA: Diagnosis not present

## 2020-02-05 ENCOUNTER — Other Ambulatory Visit: Payer: Self-pay | Admitting: Internal Medicine

## 2020-02-18 ENCOUNTER — Other Ambulatory Visit: Payer: Self-pay | Admitting: Internal Medicine

## 2020-02-18 DIAGNOSIS — E1029 Type 1 diabetes mellitus with other diabetic kidney complication: Secondary | ICD-10-CM | POA: Diagnosis not present

## 2020-02-18 DIAGNOSIS — I10 Essential (primary) hypertension: Secondary | ICD-10-CM | POA: Diagnosis not present

## 2020-03-23 DIAGNOSIS — G5601 Carpal tunnel syndrome, right upper limb: Secondary | ICD-10-CM | POA: Diagnosis not present

## 2020-03-25 DIAGNOSIS — G5601 Carpal tunnel syndrome, right upper limb: Secondary | ICD-10-CM | POA: Diagnosis not present

## 2020-04-23 DIAGNOSIS — E108 Type 1 diabetes mellitus with unspecified complications: Secondary | ICD-10-CM | POA: Diagnosis not present

## 2020-05-05 DIAGNOSIS — N898 Other specified noninflammatory disorders of vagina: Secondary | ICD-10-CM | POA: Diagnosis not present

## 2020-05-05 DIAGNOSIS — R8279 Other abnormal findings on microbiological examination of urine: Secondary | ICD-10-CM | POA: Diagnosis not present

## 2020-05-06 ENCOUNTER — Other Ambulatory Visit: Payer: Self-pay | Admitting: Obstetrics and Gynecology

## 2020-05-24 DIAGNOSIS — E109 Type 1 diabetes mellitus without complications: Secondary | ICD-10-CM | POA: Diagnosis not present

## 2020-05-26 DIAGNOSIS — E1029 Type 1 diabetes mellitus with other diabetic kidney complication: Secondary | ICD-10-CM | POA: Diagnosis not present

## 2020-06-07 ENCOUNTER — Ambulatory Visit
Admission: EM | Admit: 2020-06-07 | Discharge: 2020-06-07 | Disposition: A | Discharge status: Home / Self Care | Payer: 59

## 2020-07-06 ENCOUNTER — Other Ambulatory Visit: Payer: Self-pay | Admitting: Internal Medicine

## 2020-07-22 ENCOUNTER — Other Ambulatory Visit: Payer: Self-pay | Admitting: Internal Medicine

## 2020-07-27 DIAGNOSIS — Z01419 Encounter for gynecological examination (general) (routine) without abnormal findings: Secondary | ICD-10-CM | POA: Diagnosis not present

## 2020-09-02 DIAGNOSIS — E109 Type 1 diabetes mellitus without complications: Secondary | ICD-10-CM | POA: Diagnosis not present

## 2020-09-13 ENCOUNTER — Other Ambulatory Visit: Payer: Self-pay | Admitting: Internal Medicine

## 2020-09-13 DIAGNOSIS — E1029 Type 1 diabetes mellitus with other diabetic kidney complication: Secondary | ICD-10-CM | POA: Diagnosis not present

## 2020-10-21 ENCOUNTER — Other Ambulatory Visit: Payer: Self-pay | Admitting: Internal Medicine

## 2020-11-15 ENCOUNTER — Other Ambulatory Visit: Payer: Self-pay

## 2020-11-15 MED FILL — Insulin Lispro Subcutaneous Soln 100 Unit/ML: SUBCUTANEOUS | 28 days supply | Qty: 20 | Fill #0 | Status: CN

## 2020-11-15 MED FILL — Insulin Infusion Disposable Pump Reservoir: 30 days supply | Qty: 10 | Fill #0 | Status: AC

## 2020-11-18 ENCOUNTER — Other Ambulatory Visit: Payer: Self-pay

## 2020-11-18 MED FILL — Insulin Lispro Inj Soln 100 Unit/ML: INTRAMUSCULAR | 30 days supply | Qty: 20 | Fill #0 | Status: AC

## 2020-11-25 DIAGNOSIS — E109 Type 1 diabetes mellitus without complications: Secondary | ICD-10-CM | POA: Diagnosis not present

## 2020-12-14 ENCOUNTER — Other Ambulatory Visit: Payer: Self-pay

## 2020-12-14 MED FILL — Insulin Infusion Disposable Pump Reservoir: 30 days supply | Qty: 10 | Fill #1 | Status: AC

## 2020-12-14 MED FILL — Insulin Lispro Inj Soln 100 Unit/ML: INTRAMUSCULAR | 30 days supply | Qty: 20 | Fill #1 | Status: AC

## 2020-12-14 MED FILL — Lisinopril Tab 10 MG: ORAL | 90 days supply | Qty: 90 | Fill #0 | Status: AC

## 2020-12-15 ENCOUNTER — Other Ambulatory Visit: Payer: Self-pay

## 2021-01-06 ENCOUNTER — Other Ambulatory Visit: Payer: Self-pay

## 2021-01-06 MED FILL — Insulin Lispro Inj Soln 100 Unit/ML: INTRAMUSCULAR | 25 days supply | Qty: 20 | Fill #2 | Status: AC

## 2021-01-07 ENCOUNTER — Other Ambulatory Visit: Payer: Self-pay

## 2021-01-19 ENCOUNTER — Other Ambulatory Visit: Payer: Self-pay

## 2021-01-19 ENCOUNTER — Other Ambulatory Visit: Payer: Self-pay | Admitting: Internal Medicine

## 2021-01-21 ENCOUNTER — Other Ambulatory Visit: Payer: Self-pay

## 2021-01-21 MED ORDER — OMNIPOD DASH PODS (GEN 4) MISC
3 refills | Status: DC
  Filled 2021-01-21: qty 10, 30d supply, fill #0
  Filled 2021-02-16: qty 10, 30d supply, fill #1
  Filled 2021-03-22 (×2): qty 10, 30d supply, fill #2
  Filled 2021-04-19 (×2): qty 10, 30d supply, fill #3
  Filled 2021-05-18: qty 10, 30d supply, fill #4

## 2021-01-28 ENCOUNTER — Other Ambulatory Visit (HOSPITAL_COMMUNITY): Payer: Self-pay

## 2021-02-01 ENCOUNTER — Other Ambulatory Visit: Payer: Self-pay

## 2021-02-01 ENCOUNTER — Ambulatory Visit
Admission: EM | Admit: 2021-02-01 | Discharge: 2021-02-01 | Disposition: A | Discharge status: Home / Self Care | Payer: 59 | Attending: Emergency Medicine | Admitting: Emergency Medicine

## 2021-02-01 ENCOUNTER — Encounter: Payer: Self-pay | Admitting: Emergency Medicine

## 2021-02-01 DIAGNOSIS — N200 Calculus of kidney: Secondary | ICD-10-CM | POA: Diagnosis not present

## 2021-02-01 LAB — POCT URINALYSIS DIP (MANUAL ENTRY)
Bilirubin, UA: NEGATIVE
Blood, UA: NEGATIVE
Glucose, UA: NEGATIVE mg/dL
Ketones, POC UA: NEGATIVE mg/dL
Leukocytes, UA: NEGATIVE
Nitrite, UA: NEGATIVE
Protein Ur, POC: NEGATIVE mg/dL
Spec Grav, UA: 1.02 (ref 1.010–1.025)
Urobilinogen, UA: 1 E.U./dL
pH, UA: 6.5 (ref 5.0–8.0)

## 2021-02-01 MED ORDER — TAMSULOSIN HCL 0.4 MG PO CAPS
0.4000 mg | ORAL_CAPSULE | Freq: Every day | ORAL | 0 refills | Status: AC
  Filled 2021-02-01: qty 10, 10d supply, fill #0

## 2021-02-01 NOTE — ED Triage Notes (Signed)
Patient c/o LFT sided flank pain that started yesterday.   Patient endorses foul smelling urine.   Patient denies dysuria or hematuria. Patient denies ABD pain. Patient denies any changes to vaginal discharge.   Patient denies fever at home.   Patient hasn't taken any medications for symptoms.

## 2021-02-01 NOTE — ED Provider Notes (Signed)
Subjective:    Katie Griffin is a very pleasant 35 y.o. female who presents with concerns for UTI due to left flank pain onset yesterday and foul-smelling urine.  Patient states that she has had no changes to her diet and has not started any new calcium supplementation or increase dairy products.  Patient states that the left flank pain is constant and will last for about 2 to 3 seconds or 5 to 10 minutes at a time.  Patient states that the pain is not positional and does not report any trauma or falls precipitating current symptoms.  Patient does not report that the pain wraps around to the abdominal region and does not report any additional pain to the right flank.. No vomiting, fever, hematuria, vaginal discharge, concern for STD reported today.  Past medical history, past surgical history, current medications reviewed.  Allergies: has No Known Allergies.  Review of Systems See HPI   Objective:     Vitals:   02/01/21 1139  BP: (!) 136/99  Pulse: 96  Resp: 15  Temp: 98.9 F (37.2 C)  SpO2: 98%     General: Appears well-developed and well-nourished. No acute distress.  Cardiovascular: Normal rate . Regular rhythm; no murmurs, gallops, or rubs.  Pulm/Chest: No respiratory distress. Breath sounds normal bilaterally without wheezes, rhonchi, or rales.  Abdominal: No CVAT.  Neurological: Alert and oriented to person, place, and time.  Skin: Skin is warm and dry.  Psychiatric: Normal mood, affect, behavior, and thought content.    Laboratory:  Orders Placed This Encounter  Procedures   POCT urinalysis dipstick   Results for orders placed or performed during the hospital encounter of 02/01/21  POCT urinalysis dipstick  Result Value Ref Range   Color, UA yellow yellow   Clarity, UA clear clear   Glucose, UA negative negative mg/dL   Bilirubin, UA negative negative   Ketones, POC UA negative negative mg/dL   Spec Grav, UA 9.485 4.627 - 1.025   Blood, UA negative negative    pH, UA 6.5 5.0 - 8.0   Protein Ur, POC negative negative mg/dL   Urobilinogen, UA 1.0 0.2 or 1.0 E.U./dL   Nitrite, UA Negative Negative   Leukocytes, UA Negative Negative    -Urinalysis normal. Assessment:   1. Calculus of left kidney  Meds ordered this encounter  Medications   tamsulosin (FLOMAX) 0.4 MG CAPS capsule    Sig: Take 1 capsule (0.4 mg total) by mouth daily for 10 days.    Dispense:  10 capsule    Refill:  0    Order Specific Question:   Supervising Provider    Answer:   Merrilee Jansky [id number]     Plan:   MDM: Patient presents with concerns for UTI due to left flank pain onset yesterday and foul-smelling urine.  Patient states that she has had no changes to her diet and has not started any new calcium supplementation or increase dairy products.  Patient states that the left flank pain is constant and will last for about 2 to 3 seconds or 5 to 10 minutes at a time.  Patient states that the pain is not positional and does not report any trauma or falls precipitating current symptoms.  Patient does not report that the pain wraps around to the abdominal region and does not report any additional pain to the right flank.. No vomiting, fever, hematuria, vaginal discharge, concern for STD reported today.  Chart review completed.  Given symptoms along with  assessment findings, likely left renal calculus.  The patient is not experiencing any significant discomfort to the left kidney at this time and has no CVAT.  Urinalysis in clinic is normal today, no suspicion at this time for underlying urinary tract infection for which the patient would need antibiotics.  Discussed potential for hydronephrosis if kidney stone is unable to be passed with the tamsulosin prescription.  Advised very strict emergency department precautions for worsening of symptoms for which the patient would need imaging.  Patient verbalized understanding and agreed with plan.  Patient stable upon discharge.  Return as  needed.    Discharge Instructions      Increase fluid intake while using tamsulosin.  As discussed, if you begin to experience worsening discomfort to the left flank region, fever, chills, vomiting you need to report to the emergency department.       Amalia Greenhouse, FNP-C 02/01/21  A copy of these instructions was provided to the patient or responsible parent/guardian, who expressed understanding and agreed with the treatment plan.  All questions addressed.  This note was partially made with the aid of speech-to-text dictation; typographical errors are not intentional.    Amalia Greenhouse, [facility] 02/01/21 1208

## 2021-02-01 NOTE — Discharge Instructions (Addendum)
Increase fluid intake while using tamsulosin.  As discussed, if you begin to experience worsening discomfort to the left flank region, fever, chills, vomiting you need to report to the emergency department.

## 2021-02-03 ENCOUNTER — Other Ambulatory Visit: Payer: Self-pay

## 2021-02-03 MED FILL — Insulin Lispro Inj Soln 100 Unit/ML: INTRAMUSCULAR | 25 days supply | Qty: 20 | Fill #3 | Status: AC

## 2021-02-16 MED FILL — Insulin Syringe/Needle U-100 0.3 ML 31 x 5/16": 10 days supply | Qty: 10 | Fill #0 | Status: AC

## 2021-02-16 MED FILL — Lisinopril Tab 10 MG: ORAL | 90 days supply | Qty: 90 | Fill #0 | Status: CN

## 2021-02-17 ENCOUNTER — Other Ambulatory Visit: Payer: Self-pay

## 2021-02-23 DIAGNOSIS — E109 Type 1 diabetes mellitus without complications: Secondary | ICD-10-CM | POA: Diagnosis not present

## 2021-02-24 ENCOUNTER — Other Ambulatory Visit: Payer: Self-pay

## 2021-02-24 MED FILL — Insulin Lispro Inj Soln 100 Unit/ML: INTRAMUSCULAR | 25 days supply | Qty: 20 | Fill #4 | Status: AC

## 2021-02-25 ENCOUNTER — Other Ambulatory Visit: Payer: Self-pay

## 2021-03-09 ENCOUNTER — Other Ambulatory Visit: Payer: Self-pay

## 2021-03-09 DIAGNOSIS — E1065 Type 1 diabetes mellitus with hyperglycemia: Secondary | ICD-10-CM | POA: Diagnosis not present

## 2021-03-09 MED ORDER — LISINOPRIL 10 MG PO TABS
10.0000 mg | ORAL_TABLET | Freq: Every day | ORAL | 3 refills | Status: DC
  Filled 2021-03-09: qty 90, 90d supply, fill #0

## 2021-03-21 ENCOUNTER — Other Ambulatory Visit: Payer: Self-pay

## 2021-03-22 ENCOUNTER — Other Ambulatory Visit: Payer: Self-pay

## 2021-03-22 MED FILL — Insulin Lispro Inj Soln 100 Unit/ML: INTRAMUSCULAR | 25 days supply | Qty: 20 | Fill #5 | Status: AC

## 2021-03-23 ENCOUNTER — Other Ambulatory Visit: Payer: Self-pay

## 2021-04-05 DIAGNOSIS — Z9641 Presence of insulin pump (external) (internal): Secondary | ICD-10-CM | POA: Diagnosis not present

## 2021-04-05 DIAGNOSIS — E109 Type 1 diabetes mellitus without complications: Secondary | ICD-10-CM | POA: Diagnosis not present

## 2021-04-19 ENCOUNTER — Other Ambulatory Visit (HOSPITAL_COMMUNITY): Payer: Self-pay

## 2021-04-19 ENCOUNTER — Other Ambulatory Visit: Payer: Self-pay

## 2021-04-19 MED FILL — Insulin Lispro Inj Soln 100 Unit/ML: INTRAMUSCULAR | 25 days supply | Qty: 20 | Fill #6 | Status: AC

## 2021-05-13 ENCOUNTER — Other Ambulatory Visit: Payer: Self-pay

## 2021-05-13 MED FILL — Insulin Lispro Inj Soln 100 Unit/ML: INTRAMUSCULAR | 28 days supply | Qty: 20 | Fill #7 | Status: AC

## 2021-05-19 ENCOUNTER — Other Ambulatory Visit: Payer: Self-pay

## 2021-05-24 DIAGNOSIS — E109 Type 1 diabetes mellitus without complications: Secondary | ICD-10-CM | POA: Diagnosis not present

## 2021-06-02 ENCOUNTER — Other Ambulatory Visit: Payer: Self-pay

## 2021-06-02 MED FILL — Insulin Syringe/Needle U-100 0.3 ML 31 x 5/16": 10 days supply | Qty: 10 | Fill #1 | Status: AC

## 2021-06-04 MED FILL — Insulin Lispro Inj Soln 100 Unit/ML: INTRAMUSCULAR | 28 days supply | Qty: 20 | Fill #8 | Status: AC

## 2021-06-06 ENCOUNTER — Other Ambulatory Visit: Payer: Self-pay

## 2021-06-07 DIAGNOSIS — H5213 Myopia, bilateral: Secondary | ICD-10-CM | POA: Diagnosis not present

## 2021-06-22 ENCOUNTER — Other Ambulatory Visit: Payer: Self-pay

## 2021-06-23 ENCOUNTER — Other Ambulatory Visit (HOSPITAL_COMMUNITY): Payer: Self-pay

## 2021-06-23 ENCOUNTER — Other Ambulatory Visit: Payer: Self-pay

## 2021-06-23 MED ORDER — OMNIPOD 5 DEXG7G6 PODS GEN 5 MISC
4 refills | Status: DC
  Filled 2021-06-23: qty 10, 30d supply, fill #0
  Filled 2021-07-15: qty 10, 30d supply, fill #1
  Filled 2021-08-08 (×2): qty 10, 30d supply, fill #2
  Filled 2021-09-19: qty 10, 30d supply, fill #3
  Filled 2021-10-20: qty 10, 30d supply, fill #4

## 2021-06-28 ENCOUNTER — Other Ambulatory Visit: Payer: Self-pay

## 2021-06-28 MED FILL — Insulin Lispro Inj Soln 100 Unit/ML: INTRAMUSCULAR | 28 days supply | Qty: 20 | Fill #9 | Status: AC

## 2021-06-30 ENCOUNTER — Other Ambulatory Visit: Payer: Self-pay

## 2021-06-30 DIAGNOSIS — N898 Other specified noninflammatory disorders of vagina: Secondary | ICD-10-CM | POA: Diagnosis not present

## 2021-06-30 DIAGNOSIS — Z30431 Encounter for routine checking of intrauterine contraceptive device: Secondary | ICD-10-CM | POA: Diagnosis not present

## 2021-06-30 MED ORDER — METRONIDAZOLE 500 MG PO TABS
ORAL_TABLET | ORAL | 0 refills | Status: DC
  Filled 2021-06-30: qty 14, 7d supply, fill #0

## 2021-07-05 ENCOUNTER — Other Ambulatory Visit: Payer: Self-pay

## 2021-07-05 MED ORDER — METRONIDAZOLE 0.75 % VA GEL
VAGINAL | 0 refills | Status: DC
  Filled 2021-07-05: qty 70, 5d supply, fill #0

## 2021-07-13 ENCOUNTER — Other Ambulatory Visit: Payer: Self-pay

## 2021-07-15 ENCOUNTER — Other Ambulatory Visit: Payer: Self-pay | Admitting: Internal Medicine

## 2021-07-15 ENCOUNTER — Other Ambulatory Visit: Payer: Self-pay

## 2021-07-18 ENCOUNTER — Other Ambulatory Visit: Payer: Self-pay | Admitting: Internal Medicine

## 2021-07-18 ENCOUNTER — Other Ambulatory Visit: Payer: Self-pay

## 2021-07-19 ENCOUNTER — Other Ambulatory Visit: Payer: Self-pay

## 2021-07-20 ENCOUNTER — Other Ambulatory Visit: Payer: Self-pay

## 2021-07-20 MED ORDER — INSULIN LISPRO 100 UNIT/ML IJ SOLN
INTRAMUSCULAR | 1 refills | Status: DC
  Filled 2021-07-20: qty 20, 28d supply, fill #0
  Filled 2021-08-08 (×2): qty 20, 28d supply, fill #1

## 2021-07-21 ENCOUNTER — Other Ambulatory Visit: Payer: Self-pay

## 2021-07-22 ENCOUNTER — Other Ambulatory Visit: Payer: Self-pay

## 2021-07-22 ENCOUNTER — Telehealth: Payer: 59 | Admitting: Physician Assistant

## 2021-07-22 DIAGNOSIS — B9689 Other specified bacterial agents as the cause of diseases classified elsewhere: Secondary | ICD-10-CM

## 2021-07-22 DIAGNOSIS — J208 Acute bronchitis due to other specified organisms: Secondary | ICD-10-CM

## 2021-07-22 MED ORDER — DOXYCYCLINE HYCLATE 100 MG PO TABS
100.0000 mg | ORAL_TABLET | Freq: Two times a day (BID) | ORAL | 0 refills | Status: DC
  Filled 2021-07-22: qty 14, 7d supply, fill #0

## 2021-07-22 MED ORDER — PROMETHAZINE-DM 6.25-15 MG/5ML PO SYRP
5.0000 mL | ORAL_SOLUTION | Freq: Four times a day (QID) | ORAL | 0 refills | Status: DC | PRN
  Filled 2021-07-22: qty 118, 6d supply, fill #0

## 2021-07-22 NOTE — Progress Notes (Signed)
Virtual Visit Consent   Katie Griffin, you are scheduled for a virtual visit with a [facility] provider today.     Just as with appointments in the office, your consent must be obtained to participate.  Your consent will be active for this visit and any virtual visit you may have with one of our providers in the next 365 days.     If you have a MyChart account, a copy of this consent can be sent to you electronically.  All virtual visits are billed to your insurance company just like a traditional visit in the office.    As this is a virtual visit, video technology does not allow for your provider to perform a traditional examination.  This may limit your provider's ability to fully assess your condition.  If your provider identifies any concerns that need to be evaluated in person or the need to arrange testing (such as labs, EKG, etc.), we will make arrangements to do so.     Although advances in technology are sophisticated, we cannot ensure that it will always work on either your end or our end.  If the connection with a video visit is poor, the visit may have to be switched to a telephone visit.  With either a video or telephone visit, we are not always able to ensure that we have a secure connection.     I need to obtain your verbal consent now.   Are you willing to proceed with your visit today?    Katie Griffin has provided verbal consent on 07/22/2021 for a virtual visit (video or telephone).   Piedad Climes, [place]   Date: 07/22/2021 2:39 PM   Virtual Visit via Video Note   I, Piedad Climes, connected with  Katie Griffin  ([id number], October 03, 1985) on 07/22/21 at  2:30 PM EST by a video-enabled telemedicine application and verified that I am speaking with the correct person using two identifiers.  Location: Patient: Virtual Visit Location Patient: Home Provider: Virtual Visit Location Provider: Home Office   I discussed the limitations of evaluation and management  by telemedicine and the availability of in person appointments. The patient expressed understanding and agreed to proceed.    History of Present Illness: Katie Griffin is a 35 y.o. who identifies as a female who was assigned female at birth, and is being seen today for ongoing cough over the past several weeks. Initially with sinus symptoms and fever that improved but then moved into her chest. Now with productive cough with some chest wall tenderness. Denies SOB. Denies recent travel or sick contact.   HPI: HPI  Problems:  Patient Active Problem List   Diagnosis Date Noted   Encounter for routine checking of intrauterine contraceptive device 08/23/2015   Type 1 diabetes mellitus (HCC) 01/23/2007    Allergies: No Known Allergies Medications:  Current Outpatient Medications:    doxycycline (VIBRA-TABS) 100 MG tablet, Take 1 tablet (100 mg total) by mouth 2 (two) times daily., Disp: 14 tablet, Rfl: 0   promethazine-dextromethorphan (PROMETHAZINE-DM) 6.25-15 MG/5ML syrup, Take 5 mLs by mouth 4 (four) times daily as needed for cough., Disp: 118 mL, Rfl: 0   amLODipine (NORVASC) 10 MG tablet, Take by mouth., Disp: , Rfl:    atorvastatin (LIPITOR) 10 MG tablet, Take by mouth., Disp: , Rfl:    glucose blood test strip, , Disp: , Rfl:    hydrochlorothiazide (HYDRODIURIL) 12.5 MG tablet, Take by mouth., Disp: , Rfl:  Insulin Disposable Pump (OMNIPOD 5 G6 POD, GEN 5,) MISC, Apply 1 pod as directed and replace pod every 72 hours, Disp: 30 each, Rfl: 4   Insulin Disposable Pump (OMNIPOD DASH PODS, GEN 4,) MISC, USE AND CHANGE EVERY 72 HOURS, Disp: 15 each, Rfl: 3   insulin lispro (HUMALOG) 100 UNIT/ML injection, UP TO 70 UNITS PER DAY ADMINISTERED THROUGH INSULIN PUMP PER DOCTOR INSTRUCTIONS., Disp: 20 mL, Rfl: 1   insulin lispro (HUMALOG) 100 UNIT/ML KiwkPen, Humalog KwikPen (U-100) Insulin 100 unit/mL subcutaneous, Disp: , Rfl:    Insulin Pen Needle 31G X 8 MM MISC, , Disp: , Rfl:    Insulin  Syringe-Needle U-100 (INSULIN SYRINGE .5CC/28G) 28G X 1/2" 0.5 ML MISC, , Disp: , Rfl:    Insulin Syringe-Needle U-100 31G X 5/16" 0.3 ML MISC, USE AS NEEDED TO ADMINISTER INSULIN IN CASE OF INSULIN PUMP FAILURE., Disp: 10 each, Rfl: 5   ipratropium (ATROVENT) 0.03 % nasal spray, Place 2 sprays into both nostrils 2 (two) times daily. (Patient not taking: No sig reported), Disp: 30 mL, Rfl: 0   LANTUS SOLOSTAR 100 UNIT/ML Solostar Pen, , Disp: , Rfl: 11   levocetirizine (XYZAL) 5 MG tablet, Take 1 tablet (5 mg total) by mouth every evening. (Patient not taking: No sig reported), Disp: 30 tablet, Rfl: 2   levonorgestrel (MIRENA) 20 MCG/24HR IUD, by Intrauterine route., Disp: , Rfl:    lisinopril (ZESTRIL) 10 MG tablet, TAKE 1 TABLET BY MOUTH DAILY, Disp: 90 tablet, Rfl: 3   lisinopril (ZESTRIL) 10 MG tablet, TAKE 1 TABLET BY MOUTH DAILY., Disp: 90 tablet, Rfl: 3   lisinopril (ZESTRIL) 10 MG tablet, Take 1 tablet (10 mg total) by mouth daily., Disp: 90 tablet, Rfl: 3   meloxicam (MOBIC) 15 MG tablet, meloxicam 15 mg tablet  Take 1 tablet every day by oral route with meals., Disp: , Rfl:    metFORMIN (GLUCOPHAGE) 500 MG tablet, Take by mouth., Disp: , Rfl:   Observations/Objective: Patient is well-developed, well-nourished in no acute distress.  Resting comfortably at home.  Head is normocephalic, atraumatic.  No labored breathing. Speech is clear and coherent with logical content.  Patient is alert and oriented at baseline.   Assessment and Plan: 1. Acute bacterial bronchitis - doxycycline (VIBRA-TABS) 100 MG tablet; Take 1 tablet (100 mg total) by mouth 2 (two) times daily.  Dispense: 14 tablet; Refill: 0 - promethazine-dextromethorphan (PROMETHAZINE-DM) 6.25-15 MG/5ML syrup; Take 5 mLs by mouth 4 (four) times daily as needed for cough.  Dispense: 118 mL; Refill: 0  Rx Doxycycline.  Increase fluids.  Rest.  Saline nasal spray.  Probiotic.  Mucinex as directed.  Humidifier in bedroom.  Promethazine-DM per orders.  Call or return to clinic if symptoms are not improving.   Follow Up Instructions: I discussed the assessment and treatment plan with the patient. The patient was provided an opportunity to ask questions and all were answered. The patient agreed with the plan and demonstrated an understanding of the instructions.  A copy of instructions were sent to the patient via MyChart unless otherwise noted below.   The patient was advised to call back or seek an in-person evaluation if the symptoms worsen or if the condition fails to improve as anticipated.  Time:  I spent 12 minutes with the patient via telehealth technology discussing the above problems/concerns.    Piedad Climes, PA-C

## 2021-07-22 NOTE — Patient Instructions (Signed)
Katie Griffin, thank you for joining Piedad Climes, PA-C for today's virtual visit.  While this provider is not your primary care provider (PCP), if your PCP is located in our provider database this encounter information will be shared with them immediately following your visit.  Consent: (Patient) Katie Griffin provided verbal consent for this virtual visit at the beginning of the encounter.  Current Medications:  Current Outpatient Medications:    amLODipine (NORVASC) 10 MG tablet, Take by mouth., Disp: , Rfl:    atorvastatin (LIPITOR) 10 MG tablet, Take by mouth., Disp: , Rfl:    Benzocaine-Menthol (CEPACOL SORE THROAT) 15-3.6 MG LOZG, Use as directed 1 lozenge in the mouth or throat every 4 (four) hours., Disp: 18 each, Rfl: 0   benzonatate (TESSALON) 100 MG capsule, Take 1-2 capsules (100-200 mg total) by mouth 3 (three) times daily as needed for cough., Disp: 40 capsule, Rfl: 0   cefdinir (OMNICEF) 300 MG capsule, Take 2 capsules (600 mg total) by mouth daily. (Patient not taking: No sig reported), Disp: 20 capsule, Rfl: 0   glucose blood test strip, , Disp: , Rfl:    hydrochlorothiazide (HYDRODIURIL) 12.5 MG tablet, Take by mouth., Disp: , Rfl:    Insulin Disposable Pump (OMNIPOD 5 G6 POD, GEN 5,) MISC, Apply 1 pod as directed and replace pod every 72 hours, Disp: 30 each, Rfl: 4   Insulin Disposable Pump (OMNIPOD DASH PODS, GEN 4,) MISC, USE AND CHANGE EVERY 72 HOURS, Disp: 15 each, Rfl: 3   insulin lispro (HUMALOG) 100 UNIT/ML injection, UP TO 70 UNITS PER DAY ADMINISTERED THROUGH INSULIN PUMP PER DOCTOR INSTRUCTIONS., Disp: 20 mL, Rfl: 1   insulin lispro (HUMALOG) 100 UNIT/ML KiwkPen, Humalog KwikPen (U-100) Insulin 100 unit/mL subcutaneous, Disp: , Rfl:    Insulin Pen Needle 31G X 8 MM MISC, , Disp: , Rfl:    Insulin Syringe-Needle U-100 (INSULIN SYRINGE .5CC/28G) 28G X 1/2" 0.5 ML MISC, , Disp: , Rfl:    Insulin Syringe-Needle U-100 31G X 5/16" 0.3 ML MISC, USE AS NEEDED  TO ADMINISTER INSULIN IN CASE OF INSULIN PUMP FAILURE., Disp: 10 each, Rfl: 5   ipratropium (ATROVENT) 0.03 % nasal spray, Place 2 sprays into both nostrils 2 (two) times daily. (Patient not taking: No sig reported), Disp: 30 mL, Rfl: 0   LANTUS SOLOSTAR 100 UNIT/ML Solostar Pen, , Disp: , Rfl: 11   levocetirizine (XYZAL) 5 MG tablet, Take 1 tablet (5 mg total) by mouth every evening. (Patient not taking: No sig reported), Disp: 30 tablet, Rfl: 2   levonorgestrel (MIRENA) 20 MCG/24HR IUD, by Intrauterine route., Disp: , Rfl:    lisinopril (ZESTRIL) 10 MG tablet, TAKE 1 TABLET BY MOUTH DAILY, Disp: 90 tablet, Rfl: 3   lisinopril (ZESTRIL) 10 MG tablet, TAKE 1 TABLET BY MOUTH DAILY., Disp: 90 tablet, Rfl: 3   lisinopril (ZESTRIL) 10 MG tablet, Take 1 tablet (10 mg total) by mouth daily., Disp: 90 tablet, Rfl: 3   meloxicam (MOBIC) 15 MG tablet, meloxicam 15 mg tablet  Take 1 tablet every day by oral route with meals., Disp: , Rfl:    metFORMIN (GLUCOPHAGE) 500 MG tablet, Take by mouth., Disp: , Rfl:    metroNIDAZOLE (FLAGYL) 500 MG tablet, Take 1 tablet (500 mg total) by mouth Two (2) times a day for 7 days., Disp: 14 tablet, Rfl: 0   metroNIDAZOLE (METROGEL) 0.75 % vaginal gel, Insert . 5 gram applicatorful vaginally daily for 5 days, Disp: 70 g, Rfl: 0  oseltamivir (TAMIFLU) 75 MG capsule, Take 1 capsule (75 mg total) by mouth 2 (two) times daily., Disp: 10 capsule, Rfl: 0   Medications ordered in this encounter:  No orders of the defined types were placed in this encounter.    *If you need refills on other medications prior to your next appointment, please contact your pharmacy*  Follow-Up: Call back or seek an in-person evaluation if the symptoms worsen or if the condition fails to improve as anticipated.  Other Instructions Take antibiotic (Doxycycline) as directed.  Increase fluids.  Get plenty of rest. Use Mucinex for congestion. Use the Promethazine-DM syrup for cough. Take a daily  probiotic (I recommend Align or Culturelle, but even Activia Yogurt may be beneficial).  A humidifier placed in the bedroom may offer some relief for a dry, scratchy throat of nasal irritation.  Read information below on acute bronchitis. Please call or return to clinic if symptoms are not improving.  Acute Bronchitis Bronchitis is when the airways that extend from the windpipe into the lungs get red, puffy, and painful (inflamed). Bronchitis often causes thick spit (mucus) to develop. This leads to a cough. A cough is the most common symptom of bronchitis. In acute bronchitis, the condition usually begins suddenly and goes away over time (usually in 2 weeks). Smoking, allergies, and asthma can make bronchitis worse. Repeated episodes of bronchitis may cause more lung problems.  HOME CARE Rest. Drink enough fluids to keep your pee (urine) clear or pale yellow (unless you need to limit fluids as told by your doctor). Only take over-the-counter or prescription medicines as told by your doctor. Avoid smoking and secondhand smoke. These can make bronchitis worse. If you are a smoker, think about using nicotine gum or skin patches. Quitting smoking will help your lungs heal faster. Reduce the chance of getting bronchitis again by: Washing your hands often. Avoiding people with cold symptoms. Trying not to touch your hands to your mouth, nose, or eyes. Follow up with your doctor as told.  GET HELP IF: Your symptoms do not improve after 1 week of treatment. Symptoms include: Cough. Fever. Coughing up thick spit. Body aches. Chest congestion. Chills. Shortness of breath. Sore throat.  GET HELP RIGHT AWAY IF:  You have an increased fever. You have chills. You have severe shortness of breath. You have bloody thick spit (sputum). You throw up (vomit) often. You lose too much body fluid (dehydration). You have a severe headache. You faint.  MAKE SURE YOU:  Understand these  instructions. Will watch your condition. Will get help right away if you are not doing well or get worse. Document Released: 12/27/2007 Document Revised: 03/12/2013 Document Reviewed: 12/31/2012 [facility] Patient Information 2015 [place], [place]. This information is not intended to replace advice given to you by your health care provider. Make sure you discuss any questions you have with your health care provider.    If you have been instructed to have an in-person evaluation today at a local Urgent Care facility, please use the link below. It will take you to a list of all of our available [facility], including address, phone number and hours of operation. Please do not delay care.  [facility]  If you or a family member do not have a primary care provider, use the link below to schedule a visit and establish care. When you choose a [facility] primary care physician or advanced practice provider, you gain a long-term partner in health. Find a  Primary Care Provider  Learn more about [facility]'s in-office and virtual care options: [facility] - Get Care Now

## 2021-08-01 ENCOUNTER — Other Ambulatory Visit: Payer: Self-pay

## 2021-08-08 ENCOUNTER — Other Ambulatory Visit: Payer: Self-pay

## 2021-08-10 ENCOUNTER — Other Ambulatory Visit: Payer: Self-pay

## 2021-08-10 MED ORDER — INSULIN LISPRO 100 UNIT/ML IJ SOLN
INTRAMUSCULAR | 3 refills | Status: DC
  Filled 2021-08-10 – 2021-08-11 (×2): qty 100, 90d supply, fill #0
  Filled 2021-10-27: qty 100, 90d supply, fill #1
  Filled 2022-02-06: qty 100, 90d supply, fill #2
  Filled 2022-05-16: qty 100, 90d supply, fill #3

## 2021-08-11 ENCOUNTER — Other Ambulatory Visit: Payer: Self-pay

## 2021-08-12 ENCOUNTER — Other Ambulatory Visit: Payer: Self-pay

## 2021-08-29 DIAGNOSIS — E1065 Type 1 diabetes mellitus with hyperglycemia: Secondary | ICD-10-CM | POA: Diagnosis not present

## 2021-09-12 ENCOUNTER — Other Ambulatory Visit: Payer: Self-pay

## 2021-09-12 MED FILL — Lisinopril Tab 10 MG: ORAL | 90 days supply | Qty: 90 | Fill #1 | Status: AC

## 2021-09-12 MED FILL — Insulin Syringe/Needle U-100 0.3 ML 31 x 5/16": 10 days supply | Qty: 10 | Fill #2 | Status: AC

## 2021-09-13 ENCOUNTER — Other Ambulatory Visit: Payer: Self-pay

## 2021-09-19 ENCOUNTER — Other Ambulatory Visit: Payer: Self-pay

## 2021-10-20 ENCOUNTER — Other Ambulatory Visit: Payer: Self-pay

## 2021-10-28 ENCOUNTER — Other Ambulatory Visit: Payer: Self-pay

## 2021-11-01 ENCOUNTER — Other Ambulatory Visit: Payer: Self-pay

## 2021-11-01 ENCOUNTER — Other Ambulatory Visit (HOSPITAL_COMMUNITY): Payer: Self-pay

## 2021-11-04 ENCOUNTER — Other Ambulatory Visit: Payer: Self-pay

## 2021-11-14 ENCOUNTER — Other Ambulatory Visit: Payer: Self-pay

## 2021-11-14 MED ORDER — "INSULIN SYRINGE 31G X 5/16"" 0.3 ML MISC"
5 refills | Status: DC
  Filled 2021-11-14: qty 10, 10d supply, fill #0
  Filled 2022-02-06 (×2): qty 10, 10d supply, fill #1
  Filled 2022-05-16: qty 10, 10d supply, fill #2
  Filled 2022-08-22: qty 10, 10d supply, fill #3

## 2021-11-21 ENCOUNTER — Other Ambulatory Visit: Payer: Self-pay

## 2021-11-21 MED ORDER — OMNIPOD 5 DEXG7G6 PODS GEN 5 MISC
4 refills | Status: DC
  Filled 2021-11-21: qty 10, 30d supply, fill #0
  Filled 2021-12-27 – 2022-02-08 (×2): qty 10, 30d supply, fill #1
  Filled 2022-03-14: qty 10, 30d supply, fill #2
  Filled 2022-04-19: qty 10, 30d supply, fill #3
  Filled 2022-05-16: qty 10, 30d supply, fill #4
  Filled 2022-06-13: qty 10, 30d supply, fill #5
  Filled 2022-07-12: qty 10, 30d supply, fill #6
  Filled 2022-08-22: qty 10, 30d supply, fill #7
  Filled 2022-09-21: qty 10, 30d supply, fill #8

## 2021-12-07 DIAGNOSIS — E109 Type 1 diabetes mellitus without complications: Secondary | ICD-10-CM | POA: Diagnosis not present

## 2021-12-27 ENCOUNTER — Other Ambulatory Visit: Payer: Self-pay

## 2022-01-15 ENCOUNTER — Other Ambulatory Visit: Payer: Self-pay

## 2022-02-06 ENCOUNTER — Other Ambulatory Visit: Payer: Self-pay

## 2022-02-08 ENCOUNTER — Other Ambulatory Visit: Payer: Self-pay

## 2022-02-17 ENCOUNTER — Other Ambulatory Visit: Payer: Self-pay

## 2022-02-17 DIAGNOSIS — E1065 Type 1 diabetes mellitus with hyperglycemia: Secondary | ICD-10-CM | POA: Diagnosis not present

## 2022-02-17 DIAGNOSIS — I10 Essential (primary) hypertension: Secondary | ICD-10-CM | POA: Diagnosis not present

## 2022-02-17 MED ORDER — LISINOPRIL 10 MG PO TABS
10.0000 mg | ORAL_TABLET | Freq: Every day | ORAL | 3 refills | Status: DC
  Filled 2022-02-17 – 2022-03-14 (×2): qty 90, 90d supply, fill #0
  Filled 2022-08-22: qty 90, 90d supply, fill #1

## 2022-03-07 ENCOUNTER — Other Ambulatory Visit: Payer: Self-pay

## 2022-03-07 DIAGNOSIS — E109 Type 1 diabetes mellitus without complications: Secondary | ICD-10-CM | POA: Diagnosis not present

## 2022-03-14 ENCOUNTER — Other Ambulatory Visit: Payer: Self-pay

## 2022-04-11 DIAGNOSIS — Z794 Long term (current) use of insulin: Secondary | ICD-10-CM | POA: Diagnosis not present

## 2022-04-11 DIAGNOSIS — I10 Essential (primary) hypertension: Secondary | ICD-10-CM | POA: Diagnosis not present

## 2022-04-11 DIAGNOSIS — Z9641 Presence of insulin pump (external) (internal): Secondary | ICD-10-CM | POA: Diagnosis not present

## 2022-04-11 DIAGNOSIS — E669 Obesity, unspecified: Secondary | ICD-10-CM | POA: Diagnosis not present

## 2022-04-11 DIAGNOSIS — E109 Type 1 diabetes mellitus without complications: Secondary | ICD-10-CM | POA: Diagnosis not present

## 2022-04-19 ENCOUNTER — Other Ambulatory Visit: Payer: Self-pay

## 2022-05-16 ENCOUNTER — Other Ambulatory Visit: Payer: Self-pay

## 2022-05-29 ENCOUNTER — Other Ambulatory Visit: Payer: Self-pay

## 2022-05-29 DIAGNOSIS — I10 Essential (primary) hypertension: Secondary | ICD-10-CM | POA: Diagnosis not present

## 2022-05-29 DIAGNOSIS — Z6841 Body Mass Index (BMI) 40.0 and over, adult: Secondary | ICD-10-CM | POA: Diagnosis not present

## 2022-05-29 DIAGNOSIS — E10319 Type 1 diabetes mellitus with unspecified diabetic retinopathy without macular edema: Secondary | ICD-10-CM | POA: Diagnosis not present

## 2022-05-29 MED ORDER — OZEMPIC (0.25 OR 0.5 MG/DOSE) 2 MG/3ML ~~LOC~~ SOPN
PEN_INJECTOR | SUBCUTANEOUS | 1 refills | Status: DC
  Filled 2022-05-29: qty 3, 28d supply, fill #0
  Filled 2022-06-28 – 2022-07-13 (×2): qty 3, 28d supply, fill #1
  Filled 2022-08-09: qty 3, 28d supply, fill #2
  Filled 2022-10-04: qty 3, 28d supply, fill #3

## 2022-05-29 MED ORDER — OMRON 3 SERIES BP MONITOR DEVI
0 refills | Status: AC
  Filled 2022-05-29 – 2022-06-13 (×2): qty 1, 1d supply, fill #0

## 2022-05-29 MED ORDER — INSULIN DEGLUDEC FLEXTOUCH 100 UNIT/ML ~~LOC~~ SOPN
PEN_INJECTOR | SUBCUTANEOUS | 12 refills | Status: DC
  Filled 2022-05-29: qty 15, 53d supply, fill #0
  Filled 2022-08-22: qty 15, 53d supply, fill #1

## 2022-06-05 DIAGNOSIS — E109 Type 1 diabetes mellitus without complications: Secondary | ICD-10-CM | POA: Diagnosis not present

## 2022-06-13 ENCOUNTER — Other Ambulatory Visit: Payer: Self-pay

## 2022-06-14 ENCOUNTER — Other Ambulatory Visit: Payer: Self-pay

## 2022-06-28 ENCOUNTER — Other Ambulatory Visit: Payer: Self-pay

## 2022-07-04 DIAGNOSIS — H5213 Myopia, bilateral: Secondary | ICD-10-CM | POA: Diagnosis not present

## 2022-07-07 ENCOUNTER — Other Ambulatory Visit: Payer: Self-pay

## 2022-07-12 ENCOUNTER — Other Ambulatory Visit: Payer: Self-pay

## 2022-07-13 ENCOUNTER — Other Ambulatory Visit: Payer: Self-pay

## 2022-08-09 ENCOUNTER — Other Ambulatory Visit: Payer: Self-pay

## 2022-08-10 ENCOUNTER — Other Ambulatory Visit: Payer: Self-pay

## 2022-08-11 ENCOUNTER — Other Ambulatory Visit: Payer: Self-pay

## 2022-08-14 ENCOUNTER — Other Ambulatory Visit: Payer: Self-pay

## 2022-08-14 MED ORDER — INSULIN LISPRO 100 UNIT/ML IJ SOLN
100.0000 [IU] | Freq: Every day | INTRAMUSCULAR | 3 refills | Status: DC
  Filled 2022-08-14: qty 100, 90d supply, fill #0
  Filled 2022-12-26: qty 100, 90d supply, fill #1
  Filled 2023-04-05: qty 100, 90d supply, fill #2
  Filled 2023-07-09: qty 100, 90d supply, fill #3

## 2022-08-22 ENCOUNTER — Other Ambulatory Visit: Payer: Self-pay

## 2022-08-23 ENCOUNTER — Other Ambulatory Visit: Payer: Self-pay

## 2022-09-08 DIAGNOSIS — E109 Type 1 diabetes mellitus without complications: Secondary | ICD-10-CM | POA: Diagnosis not present

## 2022-09-21 ENCOUNTER — Other Ambulatory Visit: Payer: Self-pay

## 2022-10-06 ENCOUNTER — Other Ambulatory Visit: Payer: Self-pay

## 2022-10-06 DIAGNOSIS — Z6841 Body Mass Index (BMI) 40.0 and over, adult: Secondary | ICD-10-CM | POA: Diagnosis not present

## 2022-10-06 DIAGNOSIS — I10 Essential (primary) hypertension: Secondary | ICD-10-CM | POA: Diagnosis not present

## 2022-10-06 DIAGNOSIS — E10319 Type 1 diabetes mellitus with unspecified diabetic retinopathy without macular edema: Secondary | ICD-10-CM | POA: Diagnosis not present

## 2022-10-06 MED ORDER — GLUCAGON EMERGENCY 1 MG IJ KIT
1.0000 mg | PACK | INTRAMUSCULAR | 99 refills | Status: DC | PRN
  Filled 2022-10-06: qty 1, 1d supply, fill #0

## 2022-10-06 MED ORDER — OMNIPOD 5 DEXG7G6 PODS GEN 5 MISC
1.0000 | 4 refills | Status: DC
  Filled 2022-10-06: qty 30, 30d supply, fill #0
  Filled 2023-01-22: qty 30, 30d supply, fill #1
  Filled 2023-04-05: qty 30, 30d supply, fill #2
  Filled 2023-07-09: qty 30, 30d supply, fill #3

## 2022-10-06 MED ORDER — LISINOPRIL 20 MG PO TABS
20.0000 mg | ORAL_TABLET | Freq: Every day | ORAL | 3 refills | Status: DC
  Filled 2022-10-06: qty 90, 90d supply, fill #0
  Filled 2022-11-18 – 2023-04-05 (×2): qty 90, 90d supply, fill #1
  Filled 2023-07-23: qty 90, 90d supply, fill #2

## 2022-10-06 MED ORDER — OZEMPIC (0.25 OR 0.5 MG/DOSE) 2 MG/3ML ~~LOC~~ SOPN
0.5000 mg | PEN_INJECTOR | SUBCUTANEOUS | 5 refills | Status: DC
Start: 2022-10-06 — End: 2023-10-09
  Filled 2022-10-06: qty 6, 56d supply, fill #0
  Filled 2022-11-02 – 2022-12-21 (×7): qty 3, 28d supply, fill #0
  Filled 2023-03-15: qty 3, 28d supply, fill #1
  Filled 2023-04-19: qty 3, 28d supply, fill #2
  Filled 2023-06-15: qty 3, 28d supply, fill #3
  Filled 2023-07-23: qty 3, 28d supply, fill #4
  Filled 2023-08-18: qty 3, 28d supply, fill #5

## 2022-10-09 DIAGNOSIS — E109 Type 1 diabetes mellitus without complications: Secondary | ICD-10-CM | POA: Diagnosis not present

## 2022-10-16 ENCOUNTER — Other Ambulatory Visit: Payer: Self-pay

## 2022-10-24 ENCOUNTER — Other Ambulatory Visit: Payer: Self-pay

## 2022-11-03 ENCOUNTER — Other Ambulatory Visit: Payer: Self-pay

## 2022-11-06 ENCOUNTER — Other Ambulatory Visit: Payer: Self-pay

## 2022-11-07 ENCOUNTER — Other Ambulatory Visit: Payer: Self-pay

## 2022-11-08 ENCOUNTER — Other Ambulatory Visit: Payer: Self-pay

## 2022-11-09 DIAGNOSIS — E109 Type 1 diabetes mellitus without complications: Secondary | ICD-10-CM | POA: Diagnosis not present

## 2022-11-18 ENCOUNTER — Other Ambulatory Visit: Payer: Self-pay

## 2022-11-19 ENCOUNTER — Other Ambulatory Visit: Payer: Self-pay

## 2022-11-20 ENCOUNTER — Other Ambulatory Visit: Payer: Self-pay

## 2022-11-22 ENCOUNTER — Other Ambulatory Visit: Payer: Self-pay

## 2022-11-22 MED ORDER — "INSULIN SYRINGE-NEEDLE U-100 31G X 5/16"" 0.3 ML MISC"
5 refills | Status: DC
  Filled 2022-11-22 (×2): qty 10, 10d supply, fill #0
  Filled 2023-01-16 – 2023-01-22 (×2): qty 10, 10d supply, fill #1
  Filled 2023-01-22: qty 10, 20d supply, fill #1
  Filled 2023-03-13: qty 10, 10d supply, fill #2
  Filled 2023-04-05 – 2023-06-15 (×2): qty 10, 10d supply, fill #3
  Filled 2023-07-23: qty 10, 3d supply, fill #4
  Filled 2023-09-21: qty 10, 10d supply, fill #5

## 2022-11-23 ENCOUNTER — Other Ambulatory Visit: Payer: Self-pay

## 2022-11-23 MED ORDER — OZEMPIC (0.25 OR 0.5 MG/DOSE) 2 MG/3ML ~~LOC~~ SOPN
0.5000 mg | PEN_INJECTOR | SUBCUTANEOUS | 0 refills | Status: DC
  Filled 2022-11-23 – 2023-01-16 (×3): qty 3, 28d supply, fill #0
  Filled 2023-04-05 – 2023-05-18 (×2): qty 3, 28d supply, fill #1

## 2022-11-28 ENCOUNTER — Other Ambulatory Visit: Payer: Self-pay

## 2022-12-14 DIAGNOSIS — N898 Other specified noninflammatory disorders of vagina: Secondary | ICD-10-CM | POA: Diagnosis not present

## 2022-12-14 DIAGNOSIS — Z124 Encounter for screening for malignant neoplasm of cervix: Secondary | ICD-10-CM | POA: Diagnosis not present

## 2022-12-14 LAB — HM PAP SMEAR

## 2022-12-14 LAB — RESULTS CONSOLE HPV: CHL HPV: NEGATIVE

## 2022-12-15 ENCOUNTER — Other Ambulatory Visit: Payer: Self-pay

## 2022-12-15 MED ORDER — METRONIDAZOLE 0.75 % VA GEL
VAGINAL | 0 refills | Status: DC
  Filled 2022-12-15: qty 70, 5d supply, fill #0

## 2022-12-21 ENCOUNTER — Other Ambulatory Visit: Payer: Self-pay

## 2022-12-27 ENCOUNTER — Other Ambulatory Visit: Payer: Self-pay

## 2023-01-16 ENCOUNTER — Other Ambulatory Visit: Payer: Self-pay

## 2023-01-17 ENCOUNTER — Other Ambulatory Visit: Payer: Self-pay

## 2023-01-19 ENCOUNTER — Other Ambulatory Visit: Payer: Self-pay

## 2023-01-22 ENCOUNTER — Other Ambulatory Visit: Payer: Self-pay

## 2023-01-28 DIAGNOSIS — Z79899 Other long term (current) drug therapy: Secondary | ICD-10-CM | POA: Diagnosis not present

## 2023-01-28 DIAGNOSIS — R112 Nausea with vomiting, unspecified: Secondary | ICD-10-CM | POA: Diagnosis not present

## 2023-01-28 DIAGNOSIS — R519 Headache, unspecified: Secondary | ICD-10-CM | POA: Diagnosis not present

## 2023-01-28 DIAGNOSIS — I10 Essential (primary) hypertension: Secondary | ICD-10-CM | POA: Diagnosis not present

## 2023-01-28 DIAGNOSIS — E109 Type 1 diabetes mellitus without complications: Secondary | ICD-10-CM | POA: Diagnosis not present

## 2023-01-29 ENCOUNTER — Other Ambulatory Visit: Payer: Self-pay

## 2023-01-29 DIAGNOSIS — R112 Nausea with vomiting, unspecified: Secondary | ICD-10-CM | POA: Diagnosis not present

## 2023-01-29 MED ORDER — ONDANSETRON 4 MG PO TBDP
ORAL_TABLET | ORAL | 0 refills | Status: DC
  Filled 2023-01-29: qty 14, 2d supply, fill #0

## 2023-03-08 DIAGNOSIS — E109 Type 1 diabetes mellitus without complications: Secondary | ICD-10-CM | POA: Diagnosis not present

## 2023-03-13 ENCOUNTER — Other Ambulatory Visit: Payer: Self-pay

## 2023-03-27 DIAGNOSIS — E669 Obesity, unspecified: Secondary | ICD-10-CM | POA: Diagnosis not present

## 2023-03-27 DIAGNOSIS — E10319 Type 1 diabetes mellitus with unspecified diabetic retinopathy without macular edema: Secondary | ICD-10-CM | POA: Diagnosis not present

## 2023-03-27 DIAGNOSIS — Z6841 Body Mass Index (BMI) 40.0 and over, adult: Secondary | ICD-10-CM | POA: Diagnosis not present

## 2023-04-05 ENCOUNTER — Other Ambulatory Visit: Payer: Self-pay

## 2023-04-06 ENCOUNTER — Other Ambulatory Visit: Payer: Self-pay

## 2023-04-10 ENCOUNTER — Other Ambulatory Visit: Payer: Self-pay

## 2023-04-10 MED ORDER — LISINOPRIL 10 MG PO TABS
10.0000 mg | ORAL_TABLET | Freq: Every day | ORAL | 3 refills | Status: DC
  Filled 2023-04-10: qty 90, 90d supply, fill #0
  Filled 2023-06-15: qty 90, 90d supply, fill #1

## 2023-06-11 DIAGNOSIS — E10319 Type 1 diabetes mellitus with unspecified diabetic retinopathy without macular edema: Secondary | ICD-10-CM | POA: Diagnosis not present

## 2023-06-17 ENCOUNTER — Other Ambulatory Visit: Payer: Self-pay

## 2023-06-18 ENCOUNTER — Other Ambulatory Visit: Payer: Self-pay

## 2023-06-19 ENCOUNTER — Other Ambulatory Visit: Payer: Self-pay

## 2023-06-20 ENCOUNTER — Other Ambulatory Visit: Payer: Self-pay

## 2023-06-25 ENCOUNTER — Other Ambulatory Visit: Payer: Self-pay

## 2023-07-02 DIAGNOSIS — J069 Acute upper respiratory infection, unspecified: Secondary | ICD-10-CM | POA: Diagnosis not present

## 2023-07-13 ENCOUNTER — Other Ambulatory Visit: Payer: Self-pay

## 2023-07-23 ENCOUNTER — Other Ambulatory Visit: Payer: Self-pay

## 2023-07-24 ENCOUNTER — Other Ambulatory Visit: Payer: Self-pay

## 2023-08-23 LAB — HM DIABETES EYE EXAM

## 2023-09-09 ENCOUNTER — Emergency Department
Admission: EM | Admit: 2023-09-09 | Discharge: 2023-09-09 | Disposition: A | Discharge status: Home / Self Care | Payer: 59 | Attending: Emergency Medicine | Admitting: Emergency Medicine

## 2023-09-09 ENCOUNTER — Other Ambulatory Visit: Payer: Self-pay

## 2023-09-09 ENCOUNTER — Encounter: Payer: Self-pay | Admitting: Emergency Medicine

## 2023-09-09 ENCOUNTER — Emergency Department: Payer: 59

## 2023-09-09 DIAGNOSIS — E109 Type 1 diabetes mellitus without complications: Secondary | ICD-10-CM | POA: Diagnosis not present

## 2023-09-09 DIAGNOSIS — A084 Viral intestinal infection, unspecified: Secondary | ICD-10-CM | POA: Diagnosis not present

## 2023-09-09 DIAGNOSIS — R197 Diarrhea, unspecified: Secondary | ICD-10-CM | POA: Diagnosis not present

## 2023-09-09 DIAGNOSIS — R112 Nausea with vomiting, unspecified: Secondary | ICD-10-CM | POA: Diagnosis not present

## 2023-09-09 DIAGNOSIS — R1084 Generalized abdominal pain: Secondary | ICD-10-CM | POA: Diagnosis not present

## 2023-09-09 LAB — COMPREHENSIVE METABOLIC PANEL
ALT: 17 U/L (ref 0–44)
AST: 15 U/L (ref 15–41)
Albumin: 3.5 g/dL (ref 3.5–5.0)
Alkaline Phosphatase: 91 U/L (ref 38–126)
Anion gap: 7 (ref 5–15)
BUN: 9 mg/dL (ref 6–20)
CO2: 24 mmol/L (ref 22–32)
Calcium: 9 mg/dL (ref 8.9–10.3)
Chloride: 108 mmol/L (ref 98–111)
Creatinine, Ser: 0.69 mg/dL (ref 0.44–1.00)
GFR, Estimated: >60 mL/min (ref >60)
Glucose, Bld: 73 mg/dL (ref 70–99)
Potassium: 4.1 mmol/L (ref 3.5–5.1)
Sodium: 139 mmol/L (ref 135–145)
Total Bilirubin: 0.8 mg/dL (ref 0.0–1.2)
Total Protein: 7.3 g/dL (ref 6.5–8.1)

## 2023-09-09 LAB — CBC
HCT: 38.8 % (ref 36.0–46.0)
Hemoglobin: 13 g/dL (ref 12.0–15.0)
MCH: 31.5 pg (ref 26.0–34.0)
MCHC: 33.5 g/dL (ref 30.0–36.0)
MCV: 93.9 fL (ref 80.0–100.0)
Platelets: 478 10*3/uL — ABNORMAL HIGH (ref 150–400)
RBC: 4.13 MIL/uL (ref 3.87–5.11)
RDW: 11.8 % (ref 11.5–15.5)
WBC: 8.8 10*3/uL (ref 4.0–10.5)
nRBC: 0 % (ref 0.0–0.2)

## 2023-09-09 LAB — URINALYSIS, ROUTINE W REFLEX MICROSCOPIC
Bilirubin Urine: NEGATIVE
Glucose, UA: NEGATIVE mg/dL
Hgb urine dipstick: NEGATIVE
Ketones, ur: NEGATIVE mg/dL
Leukocytes,Ua: NEGATIVE
Nitrite: NEGATIVE
Protein, ur: NEGATIVE mg/dL
Specific Gravity, Urine: 1.02 (ref 1.005–1.030)
pH: 5 (ref 5.0–8.0)

## 2023-09-09 LAB — POC URINE PREG, ED: Preg Test, Ur: NEGATIVE

## 2023-09-09 LAB — LIPASE, BLOOD: Lipase: 20 U/L (ref 11–51)

## 2023-09-09 MED ORDER — SODIUM CHLORIDE 0.9 % IV BOLUS
1000.0000 mL | Freq: Once | INTRAVENOUS | Status: AC
  Administered 2023-09-09: 1000 mL via INTRAVENOUS

## 2023-09-09 MED ORDER — ONDANSETRON HCL 4 MG PO TABS: 4.0000 mg | ORAL_TABLET | Freq: Three times a day (TID) | ORAL | 0 refills | Status: DC | PRN

## 2023-09-09 MED ORDER — KETOROLAC TROMETHAMINE 15 MG/ML IJ SOLN
15.0000 mg | Freq: Once | INTRAMUSCULAR | Status: AC
  Administered 2023-09-09: 15 mg via INTRAVENOUS
  Filled 2023-09-09: qty 1

## 2023-09-09 MED ORDER — ONDANSETRON HCL 4 MG/2ML IJ SOLN
4.0000 mg | Freq: Once | INTRAMUSCULAR | Status: AC
  Administered 2023-09-09: 4 mg via INTRAVENOUS
  Filled 2023-09-09: qty 2

## 2023-09-09 NOTE — ED Triage Notes (Signed)
 Patient to via POV for generalized abd pain since Thursday. Having N/V/D. NAD noted at this time.

## 2023-09-09 NOTE — ED Provider Notes (Signed)
 [facility] Provider Note    Event Date/Time   First MD Initiated Contact with Patient 09/09/23 1103     (approximate)   History   Abdominal Pain   HPI Katie Griffin is a 38 y.o. female presenting today for abdominal pain.  Patient states for the past 4 days she has had nausea, vomiting, diarrhea, and generalized abdominal pain.  She has not been able to keep significant p.o. intake down.  She otherwise denies fever, chills, cough, congestion, shortness of breath, chest pain, dysuria.  Type I diabetic but has not noticed any issues with her blood sugar.  Prior history of appendectomy.  No sick contacts at home.     Physical Exam   Triage Vital Signs: ED Triage Vitals  Encounter Vitals Group     BP 09/09/23 0950 (!) 171/119     Systolic BP Percentile --      Diastolic BP Percentile --      Pulse Rate 09/09/23 0950 93     Resp 09/09/23 0950 18     Temp 09/09/23 0950 98.6 F (37 C)     Temp Source 09/09/23 0950 Oral     SpO2 09/09/23 0950 96 %     Weight 09/09/23 0951 220 lb (99.8 kg)     Height 09/09/23 0951 5\' 2"  (1.575 m)     Head Circumference --      Peak Flow --      Pain Score 09/09/23 0951 9     Pain Loc --      Pain Education --      Exclude from Growth Chart --     Most recent vital signs: Vitals:   09/09/23 0950  BP: (!) 171/119  Pulse: 93  Resp: 18  Temp: 98.6 F (37 C)  SpO2: 96%   Physical Exam: I have reviewed the vital signs and nursing notes. General: Awake, alert, no acute distress.  Nontoxic appearing. Head:  Atraumatic, normocephalic.   ENT:  EOM intact, PERRL. Oral mucosa is pink and moist with no lesions. Neck: Neck is supple with full range of motion, No meningeal signs. Cardiovascular:  RRR, No murmurs. Peripheral pulses palpable and equal bilaterally. Respiratory:  Symmetrical chest wall expansion.  No rhonchi, rales, or wheezes.  Good air movement throughout.  No use of accessory muscles.   Musculoskeletal:   No cyanosis or edema. Moving extremities with full ROM Abdomen:  Soft, generalized tenderness to palpation throughout abdomen but most prominent in the right upper quadrant, nondistended. Neuro:  GCS 15, moving all four extremities, interacting appropriately. Speech clear. Psych:  Calm, appropriate.   Skin:  Warm, dry, no rash.    ED Results / Procedures / Treatments   Labs (all labs ordered are listed, but only abnormal results are displayed) Labs Reviewed  CBC - Abnormal; Notable for the following components:      Result Value   Platelets 478 (*)    All other components within normal limits  URINALYSIS, ROUTINE W REFLEX MICROSCOPIC - Abnormal; Notable for the following components:   Color, Urine YELLOW (*)    APPearance CLEAR (*)    All other components within normal limits  LIPASE, BLOOD  COMPREHENSIVE METABOLIC PANEL  POC URINE PREG, ED     EKG    RADIOLOGY Independently interpreted right upper quadrant ultrasound with no acute findings   PROCEDURES:  Critical Care performed: No  Procedures   MEDICATIONS ORDERED IN ED: Medications  sodium chloride 0.9 %  bolus 1,000 mL (1,000 mLs Intravenous New Bag/Given 09/09/23 1136)  ondansetron (ZOFRAN) injection 4 mg (4 mg Intravenous Given 09/09/23 1131)  ketorolac (TORADOL) 15 MG/ML injection 15 mg (15 mg Intravenous Given 09/09/23 1131)     IMPRESSION / MDM / ASSESSMENT AND PLAN / ED COURSE  I reviewed the triage vital signs and the nursing notes.                              Differential diagnosis includes, but is not limited to, viral gastroenteritis, norovirus, colitis, cholecystitis  Patient's presentation is most consistent with acute complicated illness / injury requiring diagnostic workup.  Patient is a 38 year old female presenting today for nausea, vomiting, and diarrhea.  She has generalized abdominal pain but is most tender in her right upper quadrant.  Vital signs are stable and laboratory workup largely  reassuring with normal CBC, CMP, UA, and lipase.  She has been unable to tolerate p.o. Zofran so we will place an IV and give 1 L fluids, Zofran, and Toradol for pain symptoms.  Will get right upper quadrant ultrasound to rule out biliary pathology with tenderness to palpation in that area.  Right upper quadrant ultrasound unremarkable.  Patient reassessed and feeling much better at this time.  Tolerating p.o. without issue.  Will discharge with symptomatic treatment for suspected viral gastroenteritis.  Given strict return precautions for any worsening symptoms.  Clinical Course as of 09/09/23 1345  Sun Sep 09, 2023  1318 Patient feeling better at this time.  Will attempt p.o. challenge at this time [DW]  1343 Tolerated p.o. without issue [DW]    Clinical Course User Index [DW] Janith Lima, MD     FINAL CLINICAL IMPRESSION(S) / ED DIAGNOSES   Final diagnoses:  Viral gastroenteritis     Rx / DC Orders   ED Discharge Orders          Ordered    ondansetron (ZOFRAN) 4 MG tablet  Every 8 hours PRN        09/09/23 1343             Note:  This document was prepared using Dragon voice recognition software and may include unintentional dictation errors.   Janith Lima, MD 09/09/23 1345

## 2023-09-09 NOTE — Discharge Instructions (Signed)
 I have sent nausea medication to your pharmacy to help with your symptoms.  Hydration is most important over the next couple of days.  You can take ibuprofen and Tylenol as well needed for pain symptoms.  Please return for any severe worsening symptoms.

## 2023-09-10 ENCOUNTER — Other Ambulatory Visit: Payer: Self-pay

## 2023-09-10 DIAGNOSIS — E669 Obesity, unspecified: Secondary | ICD-10-CM | POA: Diagnosis not present

## 2023-09-10 DIAGNOSIS — I10 Essential (primary) hypertension: Secondary | ICD-10-CM | POA: Diagnosis not present

## 2023-09-10 DIAGNOSIS — R1032 Left lower quadrant pain: Secondary | ICD-10-CM | POA: Diagnosis not present

## 2023-09-10 DIAGNOSIS — Z7985 Long-term (current) use of injectable non-insulin antidiabetic drugs: Secondary | ICD-10-CM | POA: Diagnosis not present

## 2023-09-10 DIAGNOSIS — A084 Viral intestinal infection, unspecified: Secondary | ICD-10-CM | POA: Diagnosis not present

## 2023-09-10 DIAGNOSIS — N390 Urinary tract infection, site not specified: Secondary | ICD-10-CM | POA: Diagnosis not present

## 2023-09-10 DIAGNOSIS — R197 Diarrhea, unspecified: Secondary | ICD-10-CM | POA: Diagnosis not present

## 2023-09-10 DIAGNOSIS — Z6841 Body Mass Index (BMI) 40.0 and over, adult: Secondary | ICD-10-CM | POA: Diagnosis not present

## 2023-09-10 DIAGNOSIS — E10319 Type 1 diabetes mellitus with unspecified diabetic retinopathy without macular edema: Secondary | ICD-10-CM | POA: Diagnosis not present

## 2023-09-10 DIAGNOSIS — E109 Type 1 diabetes mellitus without complications: Secondary | ICD-10-CM | POA: Diagnosis not present

## 2023-09-10 DIAGNOSIS — R10814 Left lower quadrant abdominal tenderness: Secondary | ICD-10-CM | POA: Diagnosis not present

## 2023-09-10 DIAGNOSIS — Z794 Long term (current) use of insulin: Secondary | ICD-10-CM | POA: Diagnosis not present

## 2023-09-10 MED ORDER — DICYCLOMINE HCL 20 MG PO TABS
20.0000 mg | ORAL_TABLET | Freq: Two times a day (BID) | ORAL | 0 refills | Status: DC
  Filled 2023-09-10: qty 20, 10d supply, fill #0

## 2023-09-21 ENCOUNTER — Other Ambulatory Visit: Payer: Self-pay

## 2023-10-06 LAB — HEMOGLOBIN A1C: Hemoglobin A1C: 5.8

## 2023-10-08 ENCOUNTER — Other Ambulatory Visit: Payer: Self-pay

## 2023-10-09 ENCOUNTER — Other Ambulatory Visit: Payer: Self-pay

## 2023-10-09 MED ORDER — LISINOPRIL 20 MG PO TABS
20.0000 mg | ORAL_TABLET | Freq: Every day | ORAL | 3 refills | Status: DC
  Filled 2023-10-09: qty 90, 90d supply, fill #0

## 2023-10-09 MED ORDER — OZEMPIC (0.25 OR 0.5 MG/DOSE) 2 MG/3ML ~~LOC~~ SOPN
0.5000 mg | PEN_INJECTOR | SUBCUTANEOUS | 5 refills | Status: DC
  Filled 2023-10-09: qty 3, 28d supply, fill #0

## 2023-10-12 ENCOUNTER — Other Ambulatory Visit: Payer: Self-pay

## 2023-10-12 DIAGNOSIS — Z794 Long term (current) use of insulin: Secondary | ICD-10-CM | POA: Diagnosis not present

## 2023-10-12 DIAGNOSIS — E10319 Type 1 diabetes mellitus with unspecified diabetic retinopathy without macular edema: Secondary | ICD-10-CM | POA: Diagnosis not present

## 2023-10-12 DIAGNOSIS — E1365 Other specified diabetes mellitus with hyperglycemia: Secondary | ICD-10-CM | POA: Diagnosis not present

## 2023-10-12 DIAGNOSIS — I10 Essential (primary) hypertension: Secondary | ICD-10-CM | POA: Diagnosis not present

## 2023-10-12 DIAGNOSIS — Z6841 Body Mass Index (BMI) 40.0 and over, adult: Secondary | ICD-10-CM | POA: Diagnosis not present

## 2023-10-12 DIAGNOSIS — E1065 Type 1 diabetes mellitus with hyperglycemia: Secondary | ICD-10-CM | POA: Diagnosis not present

## 2023-10-12 DIAGNOSIS — E66813 Obesity, class 3: Secondary | ICD-10-CM | POA: Diagnosis not present

## 2023-10-12 LAB — HM DIABETES FOOT EXAM

## 2023-10-12 LAB — PROTEIN / CREATININE RATIO, URINE
Albumin, U: <0.3
Creatinine, Urine: 163.3

## 2023-10-12 MED ORDER — OMNIPOD 5 DEXG7G6 PODS GEN 5 MISC
3 refills | Status: AC
  Filled 2023-10-12: qty 30, 90d supply, fill #0
  Filled 2024-01-14: qty 30, 90d supply, fill #1
  Filled 2024-04-12: qty 30, 90d supply, fill #2

## 2023-10-12 MED ORDER — LISINOPRIL 20 MG PO TABS
20.0000 mg | ORAL_TABLET | Freq: Every day | ORAL | 3 refills | Status: DC
  Filled 2023-10-12 – 2023-11-21 (×2): qty 90, 90d supply, fill #0
  Filled 2024-02-17: qty 90, 90d supply, fill #1

## 2023-10-12 MED ORDER — INSULIN LISPRO 100 UNIT/ML IJ SOLN
100.0000 [IU] | Freq: Every day | INTRAMUSCULAR | 3 refills | Status: AC
Start: 2023-10-12 — End: ?
  Filled 2023-10-12: qty 100, 90d supply, fill #0
  Filled 2024-01-14: qty 100, 90d supply, fill #1
  Filled 2024-04-12: qty 100, 90d supply, fill #2

## 2023-10-12 MED ORDER — INSULIN DEGLUDEC FLEXTOUCH 100 UNIT/ML ~~LOC~~ SOPN
26.0000 [IU] | PEN_INJECTOR | Freq: Every day | SUBCUTANEOUS | 2 refills | Status: AC
Start: 2023-10-12 — End: ?
  Filled 2023-10-12: qty 15, 58d supply, fill #0

## 2023-10-12 MED ORDER — ATORVASTATIN CALCIUM 10 MG PO TABS
10.0000 mg | ORAL_TABLET | Freq: Every day | ORAL | 3 refills | Status: AC
  Filled 2023-10-12: qty 90, 90d supply, fill #0
  Filled 2024-01-14: qty 90, 90d supply, fill #1
  Filled 2024-04-12: qty 90, 90d supply, fill #2

## 2023-10-12 MED ORDER — OZEMPIC (1 MG/DOSE) 4 MG/3ML ~~LOC~~ SOPN
1.0000 mg | PEN_INJECTOR | SUBCUTANEOUS | 3 refills | Status: DC
  Filled 2023-10-12: qty 9, 84d supply, fill #0
  Filled 2023-11-07 – 2023-11-27 (×2): qty 3, 28d supply, fill #0

## 2023-10-12 MED ORDER — GLUCAGON EMERGENCY 1 MG IJ KIT
PACK | INTRAMUSCULAR | 99 refills | Status: AC
  Filled 2023-10-12: qty 1, 1d supply, fill #0

## 2023-10-19 ENCOUNTER — Other Ambulatory Visit: Payer: Self-pay

## 2023-11-07 ENCOUNTER — Other Ambulatory Visit: Payer: Self-pay

## 2023-11-07 MED ORDER — INSULIN SYRINGE-NEEDLE U-100 31G X 5/16" 0.3 ML MISC
5 refills | Status: DC | PRN
Start: 2023-11-07 — End: 2024-01-23
  Filled 2023-11-07: qty 10, 3d supply, fill #0
  Filled 2023-12-20 – 2023-12-21 (×2): qty 10, 3d supply, fill #1

## 2023-11-15 ENCOUNTER — Ambulatory Visit: Admitting: Family Medicine

## 2023-11-22 ENCOUNTER — Other Ambulatory Visit: Payer: Self-pay

## 2023-11-27 ENCOUNTER — Other Ambulatory Visit: Payer: Self-pay

## 2023-12-10 DIAGNOSIS — E10319 Type 1 diabetes mellitus with unspecified diabetic retinopathy without macular edema: Secondary | ICD-10-CM | POA: Diagnosis not present

## 2023-12-20 ENCOUNTER — Other Ambulatory Visit: Payer: Self-pay

## 2023-12-21 ENCOUNTER — Other Ambulatory Visit: Payer: Self-pay

## 2023-12-26 ENCOUNTER — Other Ambulatory Visit: Payer: Self-pay

## 2024-01-23 ENCOUNTER — Encounter: Payer: Self-pay | Admitting: Family Medicine

## 2024-01-23 ENCOUNTER — Ambulatory Visit (INDEPENDENT_AMBULATORY_CARE_PROVIDER_SITE_OTHER): Admitting: Family Medicine

## 2024-01-23 ENCOUNTER — Other Ambulatory Visit: Payer: Self-pay

## 2024-01-23 VITALS — BP 148/106 | HR 97 | Resp 14 | Ht 61.42 in | Wt 236.6 lb

## 2024-01-23 DIAGNOSIS — Z1159 Encounter for screening for other viral diseases: Secondary | ICD-10-CM | POA: Diagnosis not present

## 2024-01-23 DIAGNOSIS — Z0000 Encounter for general adult medical examination without abnormal findings: Secondary | ICD-10-CM

## 2024-01-23 DIAGNOSIS — I152 Hypertension secondary to endocrine disorders: Secondary | ICD-10-CM | POA: Diagnosis not present

## 2024-01-23 DIAGNOSIS — Z6841 Body Mass Index (BMI) 40.0 and over, adult: Secondary | ICD-10-CM | POA: Diagnosis not present

## 2024-01-23 DIAGNOSIS — R5382 Chronic fatigue, unspecified: Secondary | ICD-10-CM

## 2024-01-23 DIAGNOSIS — E109 Type 1 diabetes mellitus without complications: Secondary | ICD-10-CM | POA: Diagnosis not present

## 2024-01-23 DIAGNOSIS — E10319 Type 1 diabetes mellitus with unspecified diabetic retinopathy without macular edema: Secondary | ICD-10-CM | POA: Diagnosis not present

## 2024-01-23 DIAGNOSIS — Z713 Dietary counseling and surveillance: Secondary | ICD-10-CM | POA: Diagnosis not present

## 2024-01-23 DIAGNOSIS — Z0001 Encounter for general adult medical examination with abnormal findings: Secondary | ICD-10-CM | POA: Diagnosis not present

## 2024-01-23 DIAGNOSIS — Z114 Encounter for screening for human immunodeficiency virus [HIV]: Secondary | ICD-10-CM

## 2024-01-23 DIAGNOSIS — E1159 Type 2 diabetes mellitus with other circulatory complications: Secondary | ICD-10-CM

## 2024-01-23 MED ORDER — TOPIRAMATE 25 MG PO TABS
ORAL_TABLET | ORAL | 1 refills | Status: DC
  Filled 2024-01-23: qty 120, 40d supply, fill #0
  Filled 2024-03-06: qty 120, 30d supply, fill #1

## 2024-01-23 MED ORDER — HYDROCHLOROTHIAZIDE 12.5 MG PO CAPS
12.5000 mg | ORAL_CAPSULE | Freq: Every day | ORAL | 1 refills | Status: DC
  Filled 2024-01-23: qty 30, 30d supply, fill #0
  Filled 2024-02-17: qty 30, 30d supply, fill #1

## 2024-01-23 NOTE — Progress Notes (Addendum)
 New patient visit   Patient: Katie Griffin   DOB: 05-28-1986   37 y.o. Female  MRN: [id number] Visit Date: 01/23/2024  Today's healthcare provider: LAURAINE LOISE BUOY, DO   Chief Complaint  Patient presents with   Establish Care    No previous provider Last eye exam last year Decline vaccines   Subjective    Katie Griffin is a 38 y.o. female who presents today as a new patient to establish care.  HPI HPI     Establish Care    Additional comments: No previous provider Last eye exam last year Decline vaccines      Last edited by Wilfred Hargis RAMAN, CMA on 01/23/2024 10:47 AM.      Katie Griffin is a 38 year old female with type 1 diabetes who presents for a routine follow-up and management of chronic conditions.  She has type 1 diabetes, diagnosed in 1999, managed with an Omnipod insulin  pump and a Dexcom G6 continuous glucose monitor. Previously on metformin, which was discontinued, and not currently taking Ozempic  due to insurance issues. Current medications include lisinopril  20 mg and atorvastatin  10 mg. Blood pressure readings at home range from 124 to low 130s. No numbness or tingling reported.  She experiences chronic fatigue. She has a history of retinopathy and sees an eye doctor annually. She reports a chronic cough that persists after colds, lasting from November to January last year, and has been ongoing recently. She has not been checked for asthma.  She has a history of gastrointestinal issues and was prescribed Bentyl . She is scheduled to see a gastroenterologist for ongoing problems. She reports nausea, vomiting, and belly pain.  Her immunization history includes hepatitis B and pneumonia vaccines, with the last pneumonia vaccine received in 2016. She is uncertain about her HPV vaccination status but believes she received it in [place] . She has declined further vaccinations at this time.  She has a history of an appendectomy and C-section. No history of  vitamin D or B12 deficiency. She engages in regular exercise, primarily walking for 30-45 minutes.  She has experienced weight gain since discontinuing Ozempic , gaining over 10-15 pounds, which is concerning to her. She has been screened for depression, which she feels is mild to moderate. She is interested in counseling and is on a waitlist for an appointment.   To be abstracted from Care Everywhere: uACR and A1c 10/12/23, Pap smear 12/14/22 NILM, HPV negative, foot exam from endocrinology visit March 2025.     Past Medical History:  Diagnosis Date   Diabetes mellitus without complication (HCC)    Hypertension    Past Surgical History:  Procedure Laterality Date   APPENDECTOMY     CESAREAN SECTION     insulin  pump     Family Status  Relation Name Status   Mother  (Not Specified)   Father  (Not Specified)  No partnership data on file   Family History  Problem Relation Age of Onset   Hypertension Mother    Hypertension Father    Social History   Socioeconomic History   Marital status: Single    Spouse name: Not on file   Number of children: Not on file   Years of education: Not on file   Highest education level: Not on file  Occupational History   Not on file  Tobacco Use   Smoking status: Never   Smokeless tobacco: Never  Vaping Use   Vaping status: Never Used  Substance  and Sexual Activity   Alcohol use: No   Drug use: Not on file   Sexual activity: Yes    Birth control/protection: I.U.D.  Other Topics Concern   Not on file  Social History Narrative   Not on file   Social Drivers of Health   Financial Resource Strain: Low Risk  (01/23/2024)   Overall Financial Resource Strain (CARDIA)    Difficulty of Paying Living Expenses: Not hard at all  Food Insecurity: No Food Insecurity (01/23/2024)   Hunger Vital Sign    Worried About Running Out of Food in the Last Year: Never true    Ran Out of Food in the Last Year: Never true  Transportation Needs: No  Transportation Needs (01/23/2024)   PRAPARE - Administrator, Civil Service (Medical): No    Lack of Transportation (Non-Medical): No  Physical Activity: Insufficiently Active (01/23/2024)   Exercise Vital Sign    Days of Exercise per Week: 3 days    Minutes of Exercise per Session: 40 min  Stress: No Stress Concern Present (01/23/2024)   [employer] of Occupational Health - Occupational Stress Questionnaire    Feeling of Stress: Only a little  Social Connections: Not on file   Outpatient Medications Prior to Visit  Medication Sig   atorvastatin  (LIPITOR) 10 MG tablet Take 1 tablet (10 mg total) by mouth daily.   Blood Pressure Monitoring (OMRON 3 SERIES BP MONITOR) DEVI Use to check blood pressure at home once per day for history hypertension.   Glucagon , rDNA, (GLUCAGON  EMERGENCY) 1 MG KIT Use as needed in case of emergent hypoglycemia   Insulin  Pen Needle 31G X 8 MM MISC    Insulin  Syringe-Needle U-100 (INSULIN  SYRINGE .5CC/28G) 28G X 1/2 0.5 ML MISC    levonorgestrel (MIRENA) 20 MCG/24HR IUD by Intrauterine route.   lisinopril  (ZESTRIL ) 20 MG tablet Take 1 tablet (20 mg total) by mouth daily.   [DISCONTINUED] Insulin  Disposable Pump (OMNIPOD 5 G6 PODS, GEN 5,) MISC Inject 1 each under the skin every three (3) days.   [DISCONTINUED] insulin  lispro (HUMALOG ) 100 UNIT/ML KiwkPen Humalog  KwikPen (U-100) Insulin  100 unit/mL subcutaneous   Insulin  Degludec FlexTouch 100 UNIT/ML SOPN Inject 26 Units into the skin daily in case of pump failure.   Insulin  Disposable Pump (OMNIPOD 5 DEXG7G6 PODS GEN 5) MISC Use/insert 1 Pod subcutaneously every 3 days; **Must be G7 compatible Pods/Controller   insulin  lispro (HUMALOG ) 100 UNIT/ML injection Use up to 100 Units total daily administered through insulin  pump per doctor instructions.   [DISCONTINUED] amLODipine (NORVASC) 10 MG tablet Take by mouth.   [DISCONTINUED] atorvastatin  (LIPITOR) 10 MG tablet Take by mouth.   [DISCONTINUED]  dicyclomine  (BENTYL ) 20 MG tablet Take 1 tablet (20 mg total) by mouth 2 (two) times daily for 10 days.   [DISCONTINUED] doxycycline  (VIBRA -TABS) 100 MG tablet Take 1 tablet (100 mg total) by mouth 2 (two) times daily.   [DISCONTINUED] Glucagon , rDNA, (GLUCAGON  EMERGENCY) 1 MG KIT Inject 1 mg into the muscle as needed.   [DISCONTINUED] glucose blood test strip    [DISCONTINUED] hydrochlorothiazide (HYDRODIURIL) 12.5 MG tablet Take by mouth.   [DISCONTINUED] Insulin  Degludec FlexTouch 100 UNIT/ML SOPN Inject 28 units under the skin daily in case of pump failure   [DISCONTINUED] Insulin  Disposable Pump (OMNIPOD 5 G6 POD, GEN 5,) MISC Apply 1 pod as directed and replace pod every 72 hours   [DISCONTINUED] Insulin  Disposable Pump (OMNIPOD 5 G6 PODS, GEN 5,) MISC Inject 1 each under  the skin every 3 (three) days.   [DISCONTINUED] Insulin  Disposable Pump (OMNIPOD DASH PODS, GEN 4,) MISC USE AND CHANGE EVERY 72 HOURS   [DISCONTINUED] insulin  lispro (HUMALOG ) 100 UNIT/ML injection UP TO 70 UNITS PER DAY ADMINISTERED THROUGH INSULIN  PUMP PER DOCTOR INSTRUCTIONS.   [DISCONTINUED] insulin  lispro (HUMALOG ) 100 UNIT/ML injection UP TO 100 UNITS PER DAY ADMINISTERED THROUGH INSULIN  PUMP PER DOCTOR INSTRUCTIONS.   [DISCONTINUED] Insulin  Syringe-Needle U-100 31G X 5/16 0.3 ML MISC USE AS NEEDED TO ADMINISTER INSULIN  IN CASE OF INSULIN  PUMP FAILURE.   [DISCONTINUED] ipratropium (ATROVENT ) 0.03 % nasal spray Place 2 sprays into both nostrils 2 (two) times daily. (Patient not taking: No sig reported)   [DISCONTINUED] LANTUS SOLOSTAR 100 UNIT/ML Solostar Pen    [DISCONTINUED] levocetirizine (XYZAL ) 5 MG tablet Take 1 tablet (5 mg total) by mouth every evening. (Patient not taking: No sig reported)   [DISCONTINUED] lisinopril  (ZESTRIL ) 10 MG tablet TAKE 1 TABLET BY MOUTH DAILY   [DISCONTINUED] lisinopril  (ZESTRIL ) 10 MG tablet TAKE 1 TABLET BY MOUTH DAILY.   [DISCONTINUED] lisinopril  (ZESTRIL ) 10 MG tablet Take 1  tablet (10 mg total) by mouth daily.   [DISCONTINUED] lisinopril  (ZESTRIL ) 10 MG tablet Take 1 tablet (10 mg total) by mouth daily.   [DISCONTINUED] lisinopril  (ZESTRIL ) 20 MG tablet Take 1 tablet (20 mg total) by mouth daily.   [DISCONTINUED] meloxicam (MOBIC) 15 MG tablet meloxicam 15 mg tablet  Take 1 tablet every day by oral route with meals.   [DISCONTINUED] metFORMIN (GLUCOPHAGE) 500 MG tablet Take by mouth.   [DISCONTINUED] metroNIDAZOLE  (METROGEL ) 0.75 % vaginal gel Insert into the vagina daily for 5 days.   [DISCONTINUED] ondansetron  (ZOFRAN ) 4 MG tablet Take 1 tablet (4 mg total) by mouth every 8 (eight) hours as needed for vomiting or nausea.   [DISCONTINUED] ondansetron  (ZOFRAN -ODT) 4 MG disintegrating tablet Take 1 tablet (4 mg total) by mouth every eight (8) hours as needed for nausea for up to 7 days.   [DISCONTINUED] promethazine -dextromethorphan (PROMETHAZINE -DM) 6.25-15 MG/5ML syrup Take 5 mLs by mouth 4 (four) times daily as needed for cough.   [DISCONTINUED] Semaglutide , 1 MG/DOSE, (OZEMPIC , 1 MG/DOSE,) 4 MG/3ML SOPN Inject 1 mg into the skin once a week.   [DISCONTINUED] Semaglutide ,0.25 or 0.5MG /DOS, (OZEMPIC , 0.25 OR 0.5 MG/DOSE,) 2 MG/3ML SOPN Inject 0.25mg  once a week to start and continue as directed.   [DISCONTINUED] Semaglutide ,0.25 or 0.5MG /DOS, (OZEMPIC , 0.25 OR 0.5 MG/DOSE,) 2 MG/3ML SOPN Inject 0.5 mg into the skin once a week.   [DISCONTINUED] Semaglutide ,0.25 or 0.5MG /DOS, (OZEMPIC , 0.25 OR 0.5 MG/DOSE,) 2 MG/3ML SOPN Inject 0.5 mg into the skin every 7 (seven) days.   No facility-administered medications prior to visit.   No Known Allergies  Immunization History  Administered Date(s) Administered   Influenza,inj,Quad PF,6+ Mos 04/19/2015   Pneumococcal Polysaccharide-23 11/17/2014   Tdap 02/19/2015    Health Maintenance  Topic Date Due   FOOT EXAM  Never done   OPHTHALMOLOGY EXAM  Never done   HIV Screening  Never done   Diabetic kidney evaluation -  Urine ACR  Never done   Hepatitis C Screening  Never done   HEMOGLOBIN A1C  06/06/2014   Pneumococcal Vaccine 21-42 Years old (2 of 2 - PCV) 02/18/2024 (Originally 11/17/2015)   COVID-19 Vaccine (4 - 2024-25 season) 04/23/2024 (Originally 03/25/2023)   Hepatitis B Vaccines (1 of 3 - 19+ 3-dose series) 01/22/2025 (Originally 01/27/2005)   HPV VACCINES (1 - 3-dose SCDM series) 01/22/2025 (Originally 01/27/2013)   INFLUENZA VACCINE  02/22/2024   Diabetic kidney evaluation - eGFR measurement  09/08/2024   DTaP/Tdap/Td (2 - Td or Tdap) 02/18/2025   Cervical Cancer Screening (HPV/Pap Cotest)  12/14/2027   Meningococcal B Vaccine  Aged Out    Patient Care Team: Rital Cavey, Lauraine SAILOR, DO as PCP - General (Family Medicine)  Review of Systems  Constitutional:  Negative for chills, fatigue and fever.  HENT:  Negative for congestion, ear pain, rhinorrhea, sneezing and sore throat.   Eyes: Negative.  Negative for pain and redness.  Respiratory:  Positive for cough (worse in winter and again after recent cold.). Negative for shortness of breath and wheezing.   Cardiovascular:  Negative for chest pain and leg swelling.  Gastrointestinal:  Negative for abdominal pain, blood in stool, constipation, diarrhea and nausea.  Endocrine: Negative for polydipsia, polyphagia and polyuria.  Genitourinary: Negative.  Negative for dysuria, flank pain, hematuria, pelvic pain, vaginal bleeding and vaginal discharge.  Musculoskeletal:  Negative for arthralgias, back pain, gait problem and joint swelling.  Skin:  Negative for rash.  Neurological: Negative.  Negative for dizziness, tremors, seizures, weakness, light-headedness, numbness and headaches.  Hematological:  Negative for adenopathy.  Psychiatric/Behavioral: Negative.  Negative for behavioral problems, confusion and dysphoric mood. The patient is not nervous/anxious and is not hyperactive.         Objective    BP (!) 148/106 (BP Location: Left Arm, Patient Position:  Sitting, Cuff Size: Large)   Pulse 97   Resp 14   Ht 5' 1.42 (1.56 m)   Wt 236 lb 9.6 oz (107.3 kg)   SpO2 100%   BMI 44.10 kg/m     Physical Exam Vitals and nursing note reviewed.  Constitutional:      General: She is awake.     Appearance: Normal appearance.  HENT:     Head: Normocephalic and atraumatic.     Right Ear: Tympanic membrane, ear canal and external ear normal.     Left Ear: Tympanic membrane, ear canal and external ear normal.     Nose: Nose normal.     Mouth/Throat:     Mouth: Mucous membranes are moist.     Pharynx: Oropharynx is clear. No oropharyngeal exudate or posterior oropharyngeal erythema.  Eyes:     General: No scleral icterus.    Extraocular Movements: Extraocular movements intact.     Conjunctiva/sclera: Conjunctivae normal.     Pupils: Pupils are equal, round, and reactive to light.  Neck:     Thyroid : No thyromegaly or thyroid  tenderness.  Cardiovascular:     Rate and Rhythm: Normal rate and regular rhythm.     Pulses: Normal pulses.     Heart sounds: Normal heart sounds.  Pulmonary:     Effort: Pulmonary effort is normal. No tachypnea, bradypnea or respiratory distress.     Breath sounds: Normal breath sounds. No stridor. No wheezing, rhonchi or rales.  Abdominal:     General: Bowel sounds are normal. There is no distension.     Palpations: Abdomen is soft. There is no mass.     Tenderness: There is no abdominal tenderness. There is no guarding.     Hernia: No hernia is present.  Musculoskeletal:     Cervical back: Normal range of motion and neck supple.     Right lower leg: Edema present.     Left lower leg: Edema (trace) present.  Lymphadenopathy:     Cervical: No cervical adenopathy.  Skin:    General: Skin is warm and dry.  Neurological:     Mental Status: She is alert and oriented to person, place, and time. Mental status is at baseline.  Psychiatric:        Mood and Affect: Mood normal.        Behavior: Behavior normal.      Depression Screen    01/23/2024   10:48 AM  PHQ 2/9 Scores  PHQ - 2 Score 4  PHQ- 9 Score 15   No results found for any visits on 01/23/24.  Assessment & Plan     Encounter for medical examination to establish care  Type 1 diabetes mellitus with retinopathy, macular edema presence unspecified, unspecified laterality, unspecified retinopathy severity (HCC) -     Comprehensive metabolic panel with GFR -     Lipid panel  Hypertension associated with diabetes (HCC) -     hydroCHLOROthiazide; Take 1 capsule (12.5 mg total) by mouth daily.  Dispense: 30 capsule; Refill: 1  Chronic fatigue -     CBC with Differential/Platelet -     VITAMIN D 25 Hydroxy (Vit-D Deficiency, Fractures) -     Vitamin B12  Encounter for screening for HIV -     HIV Antibody (routine testing w rflx)  Encounter for hepatitis C screening test for low risk patient -     HCV Ab w Reflex to Quant PCR  Morbid obesity with BMI of 40.0-44.9, adult (HCC) -     Topiramate; Take 1 tablet (25 mg total) by mouth daily for 7 days, THEN 2 tablets (50 mg total) daily for 7 days, THEN 3 tablets (75 mg total) daily for 7 days, THEN 4 tablets (100 mg total) daily for 7 days. If unable to tolerate higher dose, stay at maximum tolerated dose.  Dispense: 120 tablet; Refill: 1  Weight loss counseling, encounter for -     Topiramate; Take 1 tablet (25 mg total) by mouth daily for 7 days, THEN 2 tablets (50 mg total) daily for 7 days, THEN 3 tablets (75 mg total) daily for 7 days, THEN 4 tablets (100 mg total) daily for 7 days. If unable to tolerate higher dose, stay at maximum tolerated dose.  Dispense: 120 tablet; Refill: 1      Encounter for medical examination to establish care Physical exam overall unremarkable except as noted above. Routine lab work ordered as noted (not fasting today). Declined pneumonia, COVID, HPV, and Hepatitis B vaccines. Up to date on eye exams and Pap smears. Discussed benefits of HPV and  pneumonia vaccines. - Encourage consideration of pneumonia and COVID vaccines at future visits. - Screen for Hepatitis C. - Continue annual eye exams and Pap smears. - Patient to send copy of vaccination record if able to locate it.  Type 1 Diabetes Mellitus; morbid obesity; weight loss counseling Long-standing Type 1 Diabetes Mellitus managed with Omnipod insulin  pump and Dexcom G6 CGM, with closed-loop system. Recent A1c checked. Weight gain after discontinuing Ozempic . Discussed weight management options, opted for topiramate. - Continue Omnipod and Dexcom G6. - Monitor A1c at next endocrinology appointment in September. - Prescribe topiramate, titrate to 100 mg as tolerated for weight management.  Hypertension Chronic, currently elevated.  Hypertension managed with lisinopril . Home readings between 124-130s, higher in clinic. Swelling possibly related to blood pressure management. Discussed restarting hydrochlorothiazide. - Restart hydrochlorothiazide 12.5 mg daily. - Follow up in 4-6 weeks to assess blood pressure control and medication efficacy. - Bring home blood pressure cuff to next appointment for calibration.  Chronic Fatigue Reports chronic  fatigue.  Will evaluate for possible metabolic sources as noted below.. - Order blood work including vitamin D, B12, and CBC to evaluate for potential deficiencies.    Return in about 4 weeks (around 02/20/2024) for HTN, Weight.     There are other unrelated non-urgent complaints, but due to the busy schedule and the amount of time I've already spent with her, time does not permit me to address these routine issues at today's visit. I've requested another appointment to review these additional issues.  I discussed the assessment and treatment plan with the patient  The patient was provided an opportunity to ask questions and all were answered. The patient agreed with the plan and demonstrated an understanding of the instructions.   The  patient was advised to call back or seek an in-person evaluation if the symptoms worsen or if the condition fails to improve as anticipated.    LAURAINE LOISE BUOY, DO  [facility] [phone] (phone) [phone] (fax)  [facility]

## 2024-01-23 NOTE — Patient Instructions (Signed)

## 2024-01-24 LAB — CBC WITH DIFFERENTIAL/PLATELET
Basophils Absolute: 0.1 10*3/uL (ref 0.0–0.2)
Basos: 1 %
EOS (ABSOLUTE): 0.4 10*3/uL (ref 0.0–0.4)
Eos: 4 %
Hematocrit: 38.4 % (ref 34.0–46.6)
Hemoglobin: 12.5 g/dL (ref 11.1–15.9)
Immature Grans (Abs): 0 10*3/uL (ref 0.0–0.1)
Immature Granulocytes: 0 %
Lymphocytes Absolute: 2.2 10*3/uL (ref 0.7–3.1)
Lymphs: 22 %
MCH: 31.5 pg (ref 26.6–33.0)
MCHC: 32.6 g/dL (ref 31.5–35.7)
MCV: 97 fL (ref 79–97)
Monocytes Absolute: 0.7 10*3/uL (ref 0.1–0.9)
Monocytes: 7 %
Neutrophils Absolute: 6.5 10*3/uL (ref 1.4–7.0)
Neutrophils: 66 %
Platelets: 501 10*3/uL — ABNORMAL HIGH (ref 150–450)
RBC: 3.97 x10E6/uL (ref 3.77–5.28)
RDW: 11.4 % — ABNORMAL LOW (ref 11.7–15.4)
WBC: 9.8 10*3/uL (ref 3.4–10.8)

## 2024-01-24 LAB — LIPID PANEL
Chol/HDL Ratio: 2.9 ratio (ref 0.0–4.4)
Cholesterol, Total: 157 mg/dL (ref 100–199)
HDL: 55 mg/dL (ref >39)
LDL Chol Calc (NIH): 88 mg/dL (ref 0–99)
Triglycerides: 71 mg/dL (ref 0–149)
VLDL Cholesterol Cal: 14 mg/dL (ref 5–40)

## 2024-01-24 LAB — HIV ANTIBODY (ROUTINE TESTING W REFLEX): HIV Screen 4th Generation wRfx: NONREACTIVE

## 2024-01-24 LAB — COMPREHENSIVE METABOLIC PANEL WITH GFR
ALT: 18 IU/L (ref 0–32)
AST: 17 IU/L (ref 0–40)
Albumin: 4.1 g/dL (ref 3.9–4.9)
Alkaline Phosphatase: 122 IU/L — ABNORMAL HIGH (ref 44–121)
BUN/Creatinine Ratio: 12 (ref 9–23)
BUN: 10 mg/dL (ref 6–20)
Bilirubin Total: 0.4 mg/dL (ref 0.0–1.2)
CO2: 20 mmol/L (ref 20–29)
Calcium: 9.3 mg/dL (ref 8.7–10.2)
Chloride: 102 mmol/L (ref 96–106)
Creatinine, Ser: 0.82 mg/dL (ref 0.57–1.00)
Globulin, Total: 2.8 g/dL (ref 1.5–4.5)
Glucose: 104 mg/dL — ABNORMAL HIGH (ref 70–99)
Potassium: 4.4 mmol/L (ref 3.5–5.2)
Sodium: 137 mmol/L (ref 134–144)
Total Protein: 6.9 g/dL (ref 6.0–8.5)
eGFR: 94 mL/min/{1.73_m2} (ref >59)

## 2024-01-24 LAB — VITAMIN B12: Vitamin B-12: 585 pg/mL (ref 232–1245)

## 2024-01-24 LAB — VITAMIN D 25 HYDROXY (VIT D DEFICIENCY, FRACTURES): Vit D, 25-Hydroxy: 8.9 ng/mL — ABNORMAL LOW (ref 30.0–100.0)

## 2024-01-24 LAB — HCV INTERPRETATION

## 2024-01-24 LAB — HCV AB W REFLEX TO QUANT PCR: HCV Ab: NONREACTIVE

## 2024-02-07 ENCOUNTER — Ambulatory Visit: Payer: Self-pay | Admitting: Family Medicine

## 2024-02-07 ENCOUNTER — Other Ambulatory Visit: Payer: Self-pay

## 2024-02-07 DIAGNOSIS — E559 Vitamin D deficiency, unspecified: Secondary | ICD-10-CM

## 2024-02-07 MED ORDER — VITAMIN D (ERGOCALCIFEROL) 1.25 MG (50000 UNIT) PO CAPS
50000.0000 [IU] | ORAL_CAPSULE | ORAL | 1 refills | Status: AC
  Filled 2024-02-07: qty 12, 84d supply, fill #0
  Filled 2024-04-25 – 2024-05-05 (×2): qty 12, 84d supply, fill #1

## 2024-02-13 ENCOUNTER — Other Ambulatory Visit: Payer: Self-pay

## 2024-02-13 ENCOUNTER — Other Ambulatory Visit: Payer: Self-pay | Admitting: Family Medicine

## 2024-02-26 ENCOUNTER — Ambulatory Visit: Admitting: Family Medicine

## 2024-03-07 ENCOUNTER — Other Ambulatory Visit: Payer: Self-pay

## 2024-03-10 DIAGNOSIS — E10319 Type 1 diabetes mellitus with unspecified diabetic retinopathy without macular edema: Secondary | ICD-10-CM | POA: Diagnosis not present

## 2024-03-19 ENCOUNTER — Encounter: Payer: Self-pay | Admitting: Family Medicine

## 2024-03-19 ENCOUNTER — Ambulatory Visit (INDEPENDENT_AMBULATORY_CARE_PROVIDER_SITE_OTHER): Admitting: Family Medicine

## 2024-03-19 ENCOUNTER — Other Ambulatory Visit: Payer: Self-pay

## 2024-03-19 VITALS — BP 121/85 | HR 104 | Ht 61.0 in | Wt 234.0 lb

## 2024-03-19 DIAGNOSIS — E10319 Type 1 diabetes mellitus with unspecified diabetic retinopathy without macular edema: Secondary | ICD-10-CM

## 2024-03-19 DIAGNOSIS — E1159 Type 2 diabetes mellitus with other circulatory complications: Secondary | ICD-10-CM

## 2024-03-19 DIAGNOSIS — Z6841 Body Mass Index (BMI) 40.0 and over, adult: Secondary | ICD-10-CM

## 2024-03-19 DIAGNOSIS — T464X5A Adverse effect of angiotensin-converting-enzyme inhibitors, initial encounter: Secondary | ICD-10-CM

## 2024-03-19 DIAGNOSIS — R058 Other specified cough: Secondary | ICD-10-CM

## 2024-03-19 DIAGNOSIS — I152 Hypertension secondary to endocrine disorders: Secondary | ICD-10-CM | POA: Diagnosis not present

## 2024-03-19 DIAGNOSIS — Z713 Dietary counseling and surveillance: Secondary | ICD-10-CM

## 2024-03-19 DIAGNOSIS — E1059 Type 1 diabetes mellitus with other circulatory complications: Secondary | ICD-10-CM

## 2024-03-19 MED ORDER — LOSARTAN POTASSIUM 50 MG PO TABS
50.0000 mg | ORAL_TABLET | Freq: Every day | ORAL | 3 refills | Status: AC
  Filled 2024-03-19: qty 90, 90d supply, fill #0
  Filled 2024-06-26: qty 90, 90d supply, fill #1

## 2024-03-19 MED ORDER — TOPIRAMATE 100 MG PO TABS
ORAL_TABLET | ORAL | 0 refills | Status: DC
  Filled 2024-03-19: qty 180, 90d supply, fill #0

## 2024-03-19 MED ORDER — TOPIRAMATE 25 MG PO TABS
ORAL_TABLET | ORAL | 1 refills | Status: AC
  Filled 2024-03-19: qty 120, 30d supply, fill #0
  Filled 2024-03-30: qty 120, fill #0
  Filled 2024-03-30 – 2024-04-11 (×3): qty 120, 30d supply, fill #0
  Filled 2024-06-26 – 2024-07-22 (×2): qty 120, 30d supply, fill #1

## 2024-03-19 MED ORDER — HYDROCHLOROTHIAZIDE 12.5 MG PO CAPS
12.5000 mg | ORAL_CAPSULE | Freq: Every day | ORAL | 1 refills | Status: DC
  Filled 2024-03-19: qty 60, 60d supply, fill #0

## 2024-03-19 NOTE — Progress Notes (Addendum)
 Established patient visit   Patient: Katie Griffin   DOB: 23-Mar-1986   38 y.o. Female  MRN: [id number] Visit Date: 03/19/2024  Today's healthcare provider: LAURAINE LOISE BUOY, DO   Chief Complaint  Patient presents with   Hypertension   Weight Loss    Things are still the same, nothing has changed    Subjective    HPI Katie Griffin is a 38 year old female with hypertension and diabetes who presents with a worsening cough.  She has a worsening cough that has not improved with albuterol or allergy medication. Multiple COVID tests have been negative. The cough is causing concern among those around her.  Her hypertension is managed with lisinopril  20 mg daily, with home blood pressure readings consistently in the 120s/80s range. She is managing her weight with topiramate , currently taking 100 mg daily. There is no significant change in appetite, and she experiences some fatigue, which initially improved but has since returned.  She engages in regular exercise, including cardio and weights, approximately twice a week for about 1.5 hours per session. She is trying to adjust her exercise schedule now that her son has returned to school.  Her diabetes management includes a focus on diet and exercise. She has declined the pneumonia vaccine but plans to receive the flu shot when available. She reports sleeping well and is currently sexually active.    uACR and A1c done 10/12/2023     Medications: Outpatient Medications Prior to Visit  Medication Sig Note   atorvastatin  (LIPITOR) 10 MG tablet Take 1 tablet (10 mg total) by mouth daily.    Blood Pressure Monitoring (OMRON 3 SERIES BP MONITOR) DEVI Use to check blood pressure at home once per day for history hypertension.    Glucagon , rDNA, (GLUCAGON  EMERGENCY) 1 MG KIT Use as needed in case of emergent hypoglycemia    Insulin  Degludec FlexTouch 100 UNIT/ML SOPN Inject 26 Units into the skin daily in case of pump failure.    Insulin   Disposable Pump (OMNIPOD 5 DEXG7G6 PODS GEN 5) MISC Use/insert 1 Pod subcutaneously every 3 days; **Must be G7 compatible Pods/Controller    insulin  lispro (HUMALOG ) 100 UNIT/ML injection Use up to 100 Units total daily administered through insulin  pump per doctor instructions.    Insulin  Pen Needle 31G X 8 MM MISC     Insulin  Syringe-Needle U-100 (INSULIN  SYRINGE .5CC/28G) 28G X 1/2 0.5 ML MISC     levonorgestrel (MIRENA) 20 MCG/24HR IUD by Intrauterine route.    Vitamin D , Ergocalciferol , (DRISDOL ) 1.25 MG (50000 UNIT) CAPS capsule Take 1 capsule (50,000 Units total) by mouth every 7 (seven) days.    [DISCONTINUED] hydrochlorothiazide  (MICROZIDE ) 12.5 MG capsule Take 1 capsule (12.5 mg total) by mouth daily.    [DISCONTINUED] lisinopril  (ZESTRIL ) 20 MG tablet Take 1 tablet (20 mg total) by mouth daily. 03/19/2024: cough   [DISCONTINUED] topiramate  (TOPAMAX ) 25 MG tablet Take 1 tablet (25 mg total) by mouth daily for 7 days, THEN 2 tablets (50 mg total) daily for 7 days, THEN 3 tablets (75 mg total) daily for 7 days, THEN 4 tablets (100 mg total) daily for 7 days. If unable to tolerate higher dose, stay at maximum tolerated dose.    No facility-administered medications prior to visit.        Objective    BP 121/85 (BP Location: Left Arm, Patient Position: Sitting, Cuff Size: Normal)   Pulse (!) 104   Ht 5' 1 (1.549 m)  Wt 234 lb (106.1 kg)   SpO2 96%   BMI 44.21 kg/m     Physical Exam Vitals and nursing note reviewed.  Constitutional:      General: She is not in acute distress.    Appearance: Normal appearance.  HENT:     Head: Normocephalic and atraumatic.  Eyes:     General: No scleral icterus.    Conjunctiva/sclera: Conjunctivae normal.  Cardiovascular:     Rate and Rhythm: Normal rate.  Pulmonary:     Effort: Pulmonary effort is normal.  Neurological:     Mental Status: She is alert and oriented to person, place, and time. Mental status is at baseline.  Psychiatric:         Mood and Affect: Mood normal.        Behavior: Behavior normal.      No results found for any visits on 03/19/24.  Assessment & Plan    Hypertension associated with type 1 diabetes mellitus (HCC) -     hydroCHLOROthiazide ; Take 1 capsule (12.5 mg total) by mouth daily.  Dispense: 30 capsule; Refill: 1 -     Losartan  Potassium; Take 1 tablet (50 mg total) by mouth daily.  Dispense: 90 tablet; Refill: 3  Morbid obesity with BMI of 40.0-44.9, adult (HCC) -     Topiramate ; Take one tablet daily, plus, the topiramate  25 mg tapered dose. When you reach 100 mg of the tapered dose, increase topiramate  100 mg to two tablets daily. Continue adding tapered dose to a total of 300 mg.  Dispense: 180 tablet; Refill: 0 -     Topiramate ; In addition to topiramate  100 mg: Week 1: Take 1 tablet daily.  Week 2: Take 2 tablets daily.  Week 3: Take 3 tablets daily.  Week 4: Take 4 tablets daily.  If unable to tolerate higher dose, stay at maximum tolerated dose. If unable to tolerate higher dose, stay at maximum tolerated dose.  Dispense: 120 tablet; Refill: 1  Weight loss counseling, encounter for -     Topiramate ; Take one tablet daily, plus, the topiramate  25 mg tapered dose. When you reach 100 mg of the tapered dose, increase topiramate  100 mg to two tablets daily. Continue adding tapered dose to a total of 300 mg.  Dispense: 180 tablet; Refill: 0 -     Topiramate ; In addition to topiramate  100 mg: Week 1: Take 1 tablet daily.  Week 2: Take 2 tablets daily.  Week 3: Take 3 tablets daily.  Week 4: Take 4 tablets daily.  If unable to tolerate higher dose, stay at maximum tolerated dose. If unable to tolerate higher dose, stay at maximum tolerated dose.  Dispense: 120 tablet; Refill: 1  Cough due to ACE inhibitor  Type 1 diabetes mellitus with retinopathy, macular edema presence unspecified, unspecified laterality, unspecified retinopathy severity (HCC)      Hypertension Managed with lisinopril  20  mg and hydrochlorothiazide  12.5 mg. Home readings prehypertensive. Prefers current regimen with lifestyle focus. - Continue hydrochlorothiazide  12.5 mg. - Transition lisinopril  20 mg to losartan  50 mg due to probable ACE-inhibitor-induced cough. - Focus on diet and exercise for blood pressure control. - Reassess management in future visits.  Morbid obesity; weight loss counseling Weight down 2 pounds since last visit.  Topiramate  at 100 mg daily has not significantly suppressed appetite.  Mild fatigue noted as a side effect. Exercise is ongoing but limited by schedule. Topiramate  may reduce Mirena's effectiveness at higher doses. - Prescribe topiramate  100 mg, titrate to  300 mg as tolerated. - Continue exercise regimen, increase as schedule allows. - Discuss topiramate  side effects, monitor effectiveness. - Consider alternative weight loss medications if ineffective. - Use condoms for additional contraception.  Chronic cough likely due to lisinopril  Chronic cough likely from lisinopril . Negative COVID-19 test. No response to albuterol or allergy meds. Cough worsening. - Switch lisinopril  20 mg to losartan  50 mg. - Monitor for cough resolution.  Lisinopril  intolerance (cough) Confirmed intolerance to lisinopril  due to chronic cough. - Add lisinopril  to allergy list as intolerance.  Type 1 diabetes mellitus with retinopathy Chronic, well-controlled/stable.  Continue insulin  degludec, insulin  lispro, atorvastatin  and CGM per endocrinology.  Management ongoing with lifestyle modifications to aid weight loss and glycemic control.   - Continue to follow with endocrinology; next appointment 05/14/2024.  Defer to specialist management.  General Health Maintenance Plans flu shot in September. Undecided on updated pneumonia vaccine, recommended due to diabetes. - Receive flu shot in September. - Consider updated pneumonia vaccine due to diabetes.     Return in about 8 weeks (around  05/14/2024) for Weight, HTN.      I discussed the assessment and treatment plan with the patient  The patient was provided an opportunity to ask questions and all were answered. The patient agreed with the plan and demonstrated an understanding of the instructions.   The patient was advised to call back or seek an in-person evaluation if the symptoms worsen or if the condition fails to improve as anticipated.    LAURAINE LOISE BUOY, DO  [facility] [phone] (phone) [phone] (fax)  [facility]

## 2024-03-27 DIAGNOSIS — I152 Hypertension secondary to endocrine disorders: Secondary | ICD-10-CM | POA: Insufficient documentation

## 2024-03-30 ENCOUNTER — Other Ambulatory Visit: Payer: Self-pay

## 2024-03-31 ENCOUNTER — Other Ambulatory Visit: Payer: Self-pay

## 2024-04-11 ENCOUNTER — Other Ambulatory Visit: Payer: Self-pay

## 2024-04-12 ENCOUNTER — Other Ambulatory Visit: Payer: Self-pay | Admitting: Family Medicine

## 2024-04-12 DIAGNOSIS — Z713 Dietary counseling and surveillance: Secondary | ICD-10-CM

## 2024-04-14 ENCOUNTER — Other Ambulatory Visit: Payer: Self-pay

## 2024-04-14 MED ORDER — TOPIRAMATE 100 MG PO TABS
ORAL_TABLET | ORAL | 0 refills | Status: AC
  Filled 2024-04-14: qty 180, fill #0
  Filled 2024-06-26: qty 180, 60d supply, fill #0

## 2024-04-14 NOTE — Telephone Encounter (Signed)
 Requested Prescriptions  Pending Prescriptions Disp Refills   topiramate  (TOPAMAX ) 100 MG tablet 180 tablet 0    Sig: Take one tablet daily, plus, the topiramate  25 mg tapered dose. When you reach 100 mg of the tapered dose, increase topiramate  100 mg to two tablets daily. Continue adding tapered dose to a total of 300 mg.     Neurology: Anticonvulsants - topiramate  & zonisamide Passed - 04/14/2024  2:40 PM      Passed - Cr in normal range and within 360 days    Creatinine  Date Value Ref Range Status  11/16/2014 0.62 mg/dL Final    Comment:    9.55-8.99 NOTE: New Reference Range  09/29/14    Creatinine, Ser  Date Value Ref Range Status  01/23/2024 0.82 0.57 - 1.00 mg/dL Final   Creatinine, Urine  Date Value Ref Range Status  10/12/2023 163.3  Final         Passed - CO2 in normal range and within 360 days    CO2  Date Value Ref Range Status  01/23/2024 20 20 - 29 mmol/L Final   Co2  Date Value Ref Range Status  11/16/2014 22 mmol/L Final    Comment:    22-32 NOTE: New Reference Range  09/29/14          Passed - ALT in normal range and within 360 days    ALT  Date Value Ref Range Status  01/23/2024 18 0 - 32 IU/L Final   SGPT (ALT)  Date Value Ref Range Status  11/16/2014 12 (L) U/L Final    Comment:    14-54 NOTE: New Reference Range  09/29/14          Passed - AST in normal range and within 360 days    AST  Date Value Ref Range Status  01/23/2024 17 0 - 40 IU/L Final   SGOT(AST)  Date Value Ref Range Status  11/16/2014 14 (L) U/L Final    Comment:    15-41 NOTE: New Reference Range  09/29/14          Passed - Completed PHQ-2 or PHQ-9 in the last 360 days      Passed - Valid encounter within last 12 months    Recent Outpatient Visits           3 weeks ago Hypertension associated with type 1 diabetes mellitus [facility])   [facility] [place], Lauraine SAILOR, DO   2 months ago Encounter for medical examination to establish care    [facility], Lauraine SAILOR, DO   5 months ago    [facility], Lauraine SAILOR, DO       Future Appointments             In 1 month Pardue, Lauraine SAILOR, DO [facility] [place], [place]

## 2024-04-21 ENCOUNTER — Other Ambulatory Visit: Payer: Self-pay

## 2024-05-05 ENCOUNTER — Other Ambulatory Visit: Payer: Self-pay

## 2024-05-14 ENCOUNTER — Other Ambulatory Visit: Payer: Self-pay

## 2024-05-14 DIAGNOSIS — E10319 Type 1 diabetes mellitus with unspecified diabetic retinopathy without macular edema: Secondary | ICD-10-CM | POA: Diagnosis not present

## 2024-05-14 DIAGNOSIS — E1065 Type 1 diabetes mellitus with hyperglycemia: Secondary | ICD-10-CM | POA: Diagnosis not present

## 2024-05-14 LAB — HEMOGLOBIN A1C: Hemoglobin A1C: 6.7

## 2024-05-14 MED ORDER — "INSULIN SYRINGE-NEEDLE U-100 31G X 5/16"" 0.3 ML MISC"
1.0000 | 5 refills | Status: AC | PRN
  Filled 2024-05-14: qty 10, 10d supply, fill #0
  Filled 2024-07-22: qty 10, 10d supply, fill #1

## 2024-05-14 MED ORDER — INSULIN DEGLUDEC FLEXTOUCH 100 UNIT/ML ~~LOC~~ SOPN
26.0000 [IU] | PEN_INJECTOR | SUBCUTANEOUS | 2 refills | Status: AC
  Filled 2024-05-14: qty 24, 90d supply, fill #0

## 2024-05-14 MED ORDER — INSULIN LISPRO 100 UNIT/ML IJ SOLN
100.0000 [IU] | Freq: Every day | INTRAMUSCULAR | 3 refills | Status: AC
  Filled 2024-07-22: qty 90, 90d supply, fill #0

## 2024-05-14 MED ORDER — OMNIPOD 5 DEXG7G6 PODS GEN 5 MISC
3 refills | Status: AC
  Filled 2024-06-26: qty 30, 90d supply, fill #0

## 2024-05-15 ENCOUNTER — Other Ambulatory Visit: Payer: Self-pay

## 2024-05-21 ENCOUNTER — Other Ambulatory Visit: Payer: Self-pay

## 2024-05-21 ENCOUNTER — Ambulatory Visit: Admitting: Family Medicine

## 2024-05-21 ENCOUNTER — Encounter: Payer: Self-pay | Admitting: Family Medicine

## 2024-05-21 VITALS — BP 125/77 | HR 103 | Temp 98.2°F | Ht 62.0 in | Wt 235.6 lb

## 2024-05-21 DIAGNOSIS — E1059 Type 1 diabetes mellitus with other circulatory complications: Secondary | ICD-10-CM

## 2024-05-21 DIAGNOSIS — Z6841 Body Mass Index (BMI) 40.0 and over, adult: Secondary | ICD-10-CM | POA: Diagnosis not present

## 2024-05-21 DIAGNOSIS — Z7689 Persons encountering health services in other specified circumstances: Secondary | ICD-10-CM

## 2024-05-21 DIAGNOSIS — I152 Hypertension secondary to endocrine disorders: Secondary | ICD-10-CM

## 2024-05-21 MED ORDER — NALTREXONE HCL 50 MG PO TABS
25.0000 mg | ORAL_TABLET | Freq: Every day | ORAL | 3 refills | Status: AC
  Filled 2024-05-21: qty 30, 30d supply, fill #0
  Filled 2024-06-26: qty 30, 30d supply, fill #1

## 2024-05-21 MED ORDER — HYDROCHLOROTHIAZIDE 12.5 MG PO CAPS
12.5000 mg | ORAL_CAPSULE | Freq: Every day | ORAL | 3 refills | Status: AC
  Filled 2024-05-21: qty 90, 90d supply, fill #0

## 2024-05-21 MED ORDER — BUPROPION HCL ER (SR) 150 MG PO TB12
ORAL_TABLET | ORAL | 3 refills | Status: AC
  Filled 2024-05-21: qty 60, 32d supply, fill #0
  Filled 2024-06-26: qty 60, 30d supply, fill #1

## 2024-05-21 NOTE — Patient Instructions (Signed)
 Reduce dose by one 25mg  tablet per week until off topiramate  completely.

## 2024-05-21 NOTE — Progress Notes (Signed)
 Established patient visit   Patient: Katie Griffin   DOB: 29-Mar-1986   38 y.o. Female  MRN: [id number] Visit Date: 05/21/2024  Today's healthcare provider: LAURAINE LOISE BUOY, DO   Chief Complaint  Patient presents with   Follow-up    Patient is here today for a 2 month up on Weight and hypertension, monitors blood pressure at home states that it has been doing pretty good.     Subjective    HPI Katie Griffin is a 39 year old female with type 1 diabetes and hypertension who presents for follow-up on blood pressure and weight management.  She has been monitoring her blood pressure, which has ranged between 120/80 mmHg and 135/85 mmHg, with occasional readings up to 140/90 mmHg. She is currently taking losartan  and hydrochlorothiazide .  For weight management, she is on topiramate , taking a total of 375 mg at night, which causes fatigue. The medication has not significantly affected her appetite. She previously tried semaglutide , which was not covered due to her type 1 diabetes. She has not tried the bupropion and naltrexone combination but is open to it.  She plans to start a new exercise routine with a friend, involving early morning gym sessions starting next month. Her current exercise includes weight lifting and treadmill workouts for about two hours per session.  She is considering getting the COVID booster. She uses [facility] for her medical needs.  No recent pregnancy and confirms having an IUD in place. She feels tired from her current medication regimen but has not noticed any changes in appetite.      Medications: Outpatient Medications Prior to Visit  Medication Sig   atorvastatin  (LIPITOR) 10 MG tablet Take 1 tablet (10 mg total) by mouth daily.   Blood Pressure Monitoring (OMRON 3 SERIES BP MONITOR) DEVI Use to check blood pressure at home once per day for history hypertension.   Glucagon , rDNA, (GLUCAGON   EMERGENCY) 1 MG KIT Use as needed in case of emergent hypoglycemia   Insulin  Degludec FlexTouch 100 UNIT/ML SOPN Inject 26 Units into the skin daily in case of pump failure.   Insulin  Degludec FlexTouch 100 UNIT/ML SOPN Use 26 units daily in case of pump failure   Insulin  Disposable Pump (OMNIPOD 5 DEXG7G6 PODS GEN 5) MISC Use/insert 1 Pod subcutaneously every 3 days; **Must be G7 compatible Pods/Controller   Insulin  Disposable Pump (OMNIPOD 5 DEXG7G6 PODS GEN 5) MISC Use/insert 1 Pod subcutaneously every 3 days; **Must be G7 compatible Pods/Controller   insulin  lispro (HUMALOG ) 100 UNIT/ML injection Use up to 100 Units total daily administered through insulin  pump per doctor instructions.   insulin  lispro (HUMALOG ) 100 UNIT/ML injection UP TO 100 UNITS PER DAY ADMINISTERED THROUGH INSULIN  PUMP PER DOCTOR INSTRUCTIONS.   Insulin  Pen Needle 31G X 8 MM MISC    Insulin  Syringe-Needle U-100 (INSULIN  SYRINGE .5CC/28G) 28G X 1/2 0.5 ML MISC    Insulin  Syringe-Needle U-100 31G X 5/16 0.3 ML MISC Inject 1 each into the skin as needed in case of insulin  pump failure.   levonorgestrel (MIRENA) 20 MCG/24HR IUD by Intrauterine route.   losartan  (COZAAR ) 50 MG tablet Take 1 tablet (50 mg total) by mouth daily.   topiramate  (TOPAMAX ) 100 MG tablet Take one tablet daily, plus, the topiramate  25 mg tapered dose. When you reach 100 mg of the tapered dose, increase topiramate  100 mg to two tablets daily. Continue adding tapered dose to a total of  300 mg.   topiramate  (TOPAMAX ) 25 MG tablet In addition to topiramate  100 mg: Week 1: Take 1 tablet daily.  Week 2: Take 2 tablets daily.  Week 3: Take 3 tablets daily.  Week 4: Take 4 tablets daily.  If unable to tolerate higher dose, stay at maximum tolerated dose.   Vitamin D , Ergocalciferol , (DRISDOL ) 1.25 MG (50000 UNIT) CAPS capsule Take 1 capsule (50,000 Units total) by mouth every 7 (seven) days.   [DISCONTINUED] hydrochlorothiazide  (MICROZIDE ) 12.5 MG capsule Take 1  capsule (12.5 mg total) by mouth daily.   No facility-administered medications prior to visit.        Objective    BP 125/77 (BP Location: Left Arm, Patient Position: Sitting, Cuff Size: Large)   Pulse (!) 103   Temp 98.2 F (36.8 C) (Oral)   Ht 5' 2 (1.575 m)   Wt 235 lb 9.6 oz (106.9 kg)   SpO2 100%   BMI 43.09 kg/m     Physical Exam Constitutional:      Appearance: Normal appearance.  HENT:     Head: Normocephalic and atraumatic.  Eyes:     General: No scleral icterus.    Extraocular Movements: Extraocular movements intact.     Conjunctiva/sclera: Conjunctivae normal.  Cardiovascular:     Rate and Rhythm: Normal rate and regular rhythm.     Pulses: Normal pulses.     Heart sounds: Normal heart sounds.  Pulmonary:     Effort: Pulmonary effort is normal. No respiratory distress.     Breath sounds: Normal breath sounds.  Abdominal:     General: Bowel sounds are normal.     Palpations: Abdomen is soft.  Musculoskeletal:     Right lower leg: No edema.     Left lower leg: No edema.  Skin:    General: Skin is warm and dry.  Neurological:     Mental Status: She is alert and oriented to person, place, and time. Mental status is at baseline.  Psychiatric:        Mood and Affect: Mood normal.        Behavior: Behavior normal.      No results found for any visits on 05/21/24.  From care everywhere: 10/12/2023 albumin/creatinine urine ratio 0 (using urine albumin level 0.3), urine creatinine 163.3; urine albumin quantitative <0.3 05/14/2024 POCT glycated hemoglobin (HGB A1c) 6.7    Assessment & Plan    Hypertension associated with type 1 diabetes mellitus (HCC) -     Microalbumin / creatinine urine ratio -     hydroCHLOROthiazide ; Take 1 capsule (12.5 mg total) by mouth daily.  Dispense: 90 capsule; Refill: 3  Morbid obesity with BMI of 40.0-44.9, adult (HCC) -     buPROPion HCl ER (SR); Take 1 tablet (150 mg total) by mouth daily for 3 days, THEN 1 tablet  (150 mg total) 2 (two) times daily.  Dispense: 60 tablet; Refill: 3 -     Naltrexone HCl; Take 0.5-1 tablets (25-50 mg total) by mouth daily.  Dispense: 30 tablet; Refill: 3  Encounter for weight management -     buPROPion HCl ER (SR); Take 1 tablet (150 mg total) by mouth daily for 3 days, THEN 1 tablet (150 mg total) 2 (two) times daily.  Dispense: 60 tablet; Refill: 3 -     Naltrexone HCl; Take 0.5-1 tablets (25-50 mg total) by mouth daily.  Dispense: 30 tablet; Refill: 3     Hypertension associated with type 1 diabetes mellitus Blood pressure  well-controlled with losartan , occasional readings up to 140 mmHg. Goal to maintain or lower current range. - Continue losartan . - Monitor blood pressure.  Morbid obesity (BMI 40.0-44.9); encounter for weight loss managed Topiramate  at full dosage caused fatigue without appetite reduction. Semaglutide  not covered due to type 1 diabetes. Open to bupropion/naltrexone trial. Active exercise regimen in place. - Taper topiramate  - Reduce dose by one 25mg  tablet per week until off topiramate  completely.  - Start bupropion/naltrexone trial. - Encourage regular exercise. - Monitor appetite and adjust medication.  General Health Maintenance Plans for COVID booster. IUD in place for contraception. - Schedule COVID booster.    Return in about 3 months (around 08/21/2024) for Weight.      I discussed the assessment and treatment plan with the patient  The patient was provided an opportunity to ask questions and all were answered. The patient agreed with the plan and demonstrated an understanding of the instructions.   The patient was advised to call back or seek an in-person evaluation if the symptoms worsen or if the condition fails to improve as anticipated.    LAURAINE LOISE BUOY, DO  [facility] [phone] (phone) [phone] (fax)  [facility]

## 2024-05-26 ENCOUNTER — Encounter: Payer: Self-pay | Admitting: Family Medicine

## 2024-05-26 ENCOUNTER — Telehealth: Admitting: Physician Assistant

## 2024-05-26 DIAGNOSIS — Z7189 Other specified counseling: Secondary | ICD-10-CM

## 2024-05-26 NOTE — Progress Notes (Signed)
 Virtual Visit Consent   Katie Griffin, you are scheduled for a virtual visit with a [facility] provider today. Just as with appointments in the office, your consent must be obtained to participate. Your consent will be active for this visit and any virtual visit you may have with one of our providers in the next 365 days. If you have a MyChart account, a copy of this consent can be sent to you electronically.  As this is a virtual visit, video technology does not allow for your provider to perform a traditional examination. This may limit your provider's ability to fully assess your condition. If your provider identifies any concerns that need to be evaluated in person or the need to arrange testing (such as labs, EKG, etc.), we will make arrangements to do so. Although advances in technology are sophisticated, we cannot ensure that it will always work on either your end or our end. If the connection with a video visit is poor, the visit may have to be switched to a telephone visit. With either a video or telephone visit, we are not always able to ensure that we have a secure connection.  By engaging in this virtual visit, you consent to the provision of healthcare and authorize for your insurance to be billed (if applicable) for the services provided during this visit. Depending on your insurance coverage, you may receive a charge related to this service.  I need to obtain your verbal consent now. Are you willing to proceed with your visit today? Katie Griffin has provided verbal consent on 05/26/2024 for a virtual visit (video or telephone). [name], [place]  Date: 05/26/2024 3:39 PM   Virtual Visit via Video Note   I, [name], connected with  Katie Griffin  ([id number], 1986/05/21) on 05/26/24 at  3:30 PM EST by a video-enabled telemedicine application and verified that I am speaking with the correct person using two identifiers.  Location: Patient: Virtual Visit Location Patient:  Home Provider: Virtual Visit Location Provider: Home Office   I discussed the limitations of evaluation and management by telemedicine and the availability of in person appointments. The patient expressed understanding and agreed to proceed.    History of Present Illness: Katie Griffin is a 38 y.o. who identifies as a female who was assigned female at birth, and is being seen today for medication concern. States she was grabbed a new medication by her provider and within the first hour her blood pressure dropped, this was on Saturday. She states EMS monitored her BP including orthostatics. She declined to go to the ER at that time. She states she was told to reach out to her provider regarding the med however she hasn't received a response. She has not taken the medication since Saturday. Denies any additional symptoms at this time.   HPI: HPI  Problems:  Patient Active Problem List   Diagnosis Date Noted   Hypertension associated with type 1 diabetes mellitus (HCC) 03/27/2024   Morbid obesity with BMI of 40.0-44.9, adult (HCC) 03/27/2024   Encounter for routine checking of intrauterine contraceptive device 08/23/2015   Type 1 diabetes mellitus with retinopathy (HCC) 01/23/2007    Allergies:  Allergies  Allergen Reactions   Lisinopril  Cough   Medications:  Current Outpatient Medications:    atorvastatin  (LIPITOR) 10 MG tablet, Take 1 tablet (10 mg total) by mouth daily., Disp: 100 tablet, Rfl: 3   Blood Pressure Monitoring (OMRON 3 SERIES BP MONITOR) DEVI, Use to check  blood pressure at home once per day for history hypertension., Disp: 1 each, Rfl: 0   buPROPion (WELLBUTRIN SR) 150 MG 12 hr tablet, Take 1 tablet (150 mg total) by mouth daily for 3 days, THEN 1 tablet (150 mg total) 2 (two) times daily., Disp: 60 tablet, Rfl: 3   Glucagon , rDNA, (GLUCAGON  EMERGENCY) 1 MG KIT, Use as needed in case of emergent hypoglycemia, Disp: 1 kit, Rfl: 99   hydrochlorothiazide  (MICROZIDE ) 12.5 MG  capsule, Take 1 capsule (12.5 mg total) by mouth daily., Disp: 90 capsule, Rfl: 3   Insulin  Degludec FlexTouch 100 UNIT/ML SOPN, Inject 26 Units into the skin daily in case of pump failure., Disp: 30 mL, Rfl: 2   Insulin  Degludec FlexTouch 100 UNIT/ML SOPN, Use 26 units daily in case of pump failure, Disp: 30 mL, Rfl: 2   Insulin  Disposable Pump (OMNIPOD 5 DEXG7G6 PODS GEN 5) MISC, Use/insert 1 Pod subcutaneously every 3 days; **Must be G7 compatible Pods/Controller, Disp: 30 each, Rfl: 3   Insulin  Disposable Pump (OMNIPOD 5 DEXG7G6 PODS GEN 5) MISC, Use/insert 1 Pod subcutaneously every 3 days; **Must be G7 compatible Pods/Controller, Disp: 30 each, Rfl: 3   insulin  lispro (HUMALOG ) 100 UNIT/ML injection, Use up to 100 Units total daily administered through insulin  pump per doctor instructions., Disp: 100 mL, Rfl: 3   insulin  lispro (HUMALOG ) 100 UNIT/ML injection, UP TO 100 UNITS PER DAY ADMINISTERED THROUGH INSULIN  PUMP PER DOCTOR INSTRUCTIONS., Disp: 100 mL, Rfl: 3   Insulin  Pen Needle 31G X 8 MM MISC, , Disp: , Rfl:    Insulin  Syringe-Needle U-100 (INSULIN  SYRINGE .5CC/28G) 28G X 1/2 0.5 ML MISC, , Disp: , Rfl:    Insulin  Syringe-Needle U-100 31G X 5/16 0.3 ML MISC, Inject 1 each into the skin as needed in case of insulin  pump failure., Disp: 10 each, Rfl: 5   levonorgestrel (MIRENA) 20 MCG/24HR IUD, by Intrauterine route., Disp: , Rfl:    losartan  (COZAAR ) 50 MG tablet, Take 1 tablet (50 mg total) by mouth daily., Disp: 90 tablet, Rfl: 3   naltrexone (DEPADE) 50 MG tablet, Take 0.5-1 tablets (25-50 mg total) by mouth daily., Disp: 30 tablet, Rfl: 3   topiramate  (TOPAMAX ) 100 MG tablet, Take one tablet daily, plus, the topiramate  25 mg tapered dose. When you reach 100 mg of the tapered dose, increase topiramate  100 mg to two tablets daily. Continue adding tapered dose to a total of 300 mg., Disp: 180 tablet, Rfl: 0   topiramate  (TOPAMAX ) 25 MG tablet, In addition to topiramate  100 mg: Week 1:  Take 1 tablet daily.  Week 2: Take 2 tablets daily.  Week 3: Take 3 tablets daily.  Week 4: Take 4 tablets daily.  If unable to tolerate higher dose, stay at maximum tolerated dose., Disp: 120 tablet, Rfl: 1   Vitamin D , Ergocalciferol , (DRISDOL ) 1.25 MG (50000 UNIT) CAPS capsule, Take 1 capsule (50,000 Units total) by mouth every 7 (seven) days., Disp: 12 capsule, Rfl: 1  Observations/Objective: Patient is well-developed, well-nourished in no acute distress.  Resting comfortably  at home.  Head is normocephalic, atraumatic.  No labored breathing.  Speech is clear and coherent with logical content.  Patient is alert and oriented at baseline.    Assessment and Plan: 1. Encounter for medication counseling (Primary)  Patient advised not to continue medication at this time. I shared with the patient that I will route the chart to her primary provider so that she is aware of this visit on today.  All questions answered. No emergent conditions or symptoms noted.   Follow Up Instructions: I discussed the assessment and treatment plan with the patient. The patient was provided an opportunity to ask questions and all were answered. The patient agreed with the plan and demonstrated an understanding of the instructions.  A copy of instructions were sent to the patient via MyChart unless otherwise noted below.    The patient was advised to call back or seek an in-person evaluation if the symptoms worsen or if the condition fails to improve as anticipated.    [name], PA-C

## 2024-05-26 NOTE — Patient Instructions (Signed)
 Katie Griffin, thank you for joining [name], PA-C for today's virtual visit.  While this provider is not your primary care provider (PCP), if your PCP is located in our provider database this encounter information will be shared with them immediately following your visit.   A [facility] MyChart account gives you access to today's visit and all your visits, tests, and labs performed at [facility]  click here if you don't have a [facility] MyChart account or go to [email or web address]  Consent: (Patient) Katie Griffin provided verbal consent for this virtual visit at the beginning of the encounter.  Current Medications:  Current Outpatient Medications:    atorvastatin  (LIPITOR) 10 MG tablet, Take 1 tablet (10 mg total) by mouth daily., Disp: 100 tablet, Rfl: 3   Blood Pressure Monitoring (OMRON 3 SERIES BP MONITOR) DEVI, Use to check blood pressure at home once per day for history hypertension., Disp: 1 each, Rfl: 0   buPROPion (WELLBUTRIN SR) 150 MG 12 hr tablet, Take 1 tablet (150 mg total) by mouth daily for 3 days, THEN 1 tablet (150 mg total) 2 (two) times daily., Disp: 60 tablet, Rfl: 3   Glucagon , rDNA, (GLUCAGON  EMERGENCY) 1 MG KIT, Use as needed in case of emergent hypoglycemia, Disp: 1 kit, Rfl: 99   hydrochlorothiazide  (MICROZIDE ) 12.5 MG capsule, Take 1 capsule (12.5 mg total) by mouth daily., Disp: 90 capsule, Rfl: 3   Insulin  Degludec FlexTouch 100 UNIT/ML SOPN, Inject 26 Units into the skin daily in case of pump failure., Disp: 30 mL, Rfl: 2   Insulin  Degludec FlexTouch 100 UNIT/ML SOPN, Use 26 units daily in case of pump failure, Disp: 30 mL, Rfl: 2   Insulin  Disposable Pump (OMNIPOD 5 DEXG7G6 PODS GEN 5) MISC, Use/insert 1 Pod subcutaneously every 3 days; **Must be G7 compatible Pods/Controller, Disp: 30 each, Rfl: 3   Insulin  Disposable Pump (OMNIPOD 5 DEXG7G6 PODS GEN 5) MISC, Use/insert 1 Pod subcutaneously every 3 days; **Must be G7 compatible  Pods/Controller, Disp: 30 each, Rfl: 3   insulin  lispro (HUMALOG ) 100 UNIT/ML injection, Use up to 100 Units total daily administered through insulin  pump per doctor instructions., Disp: 100 mL, Rfl: 3   insulin  lispro (HUMALOG ) 100 UNIT/ML injection, UP TO 100 UNITS PER DAY ADMINISTERED THROUGH INSULIN  PUMP PER DOCTOR INSTRUCTIONS., Disp: 100 mL, Rfl: 3   Insulin  Pen Needle 31G X 8 MM MISC, , Disp: , Rfl:    Insulin  Syringe-Needle U-100 (INSULIN  SYRINGE .5CC/28G) 28G X 1/2 0.5 ML MISC, , Disp: , Rfl:    Insulin  Syringe-Needle U-100 31G X 5/16 0.3 ML MISC, Inject 1 each into the skin as needed in case of insulin  pump failure., Disp: 10 each, Rfl: 5   levonorgestrel (MIRENA) 20 MCG/24HR IUD, by Intrauterine route., Disp: , Rfl:    losartan  (COZAAR ) 50 MG tablet, Take 1 tablet (50 mg total) by mouth daily., Disp: 90 tablet, Rfl: 3   naltrexone (DEPADE) 50 MG tablet, Take 0.5-1 tablets (25-50 mg total) by mouth daily., Disp: 30 tablet, Rfl: 3   topiramate  (TOPAMAX ) 100 MG tablet, Take one tablet daily, plus, the topiramate  25 mg tapered dose. When you reach 100 mg of the tapered dose, increase topiramate  100 mg to two tablets daily. Continue adding tapered dose to a total of 300 mg., Disp: 180 tablet, Rfl: 0   topiramate  (TOPAMAX ) 25 MG tablet, In addition to topiramate  100 mg: Week 1: Take 1 tablet daily.  Week 2: Take 2 tablets daily.  Week  3: Take 3 tablets daily.  Week 4: Take 4 tablets daily.  If unable to tolerate higher dose, stay at maximum tolerated dose., Disp: 120 tablet, Rfl: 1   Vitamin D , Ergocalciferol , (DRISDOL ) 1.25 MG (50000 UNIT) CAPS capsule, Take 1 capsule (50,000 Units total) by mouth every 7 (seven) days., Disp: 12 capsule, Rfl: 1   Medications ordered in this encounter:  No orders of the defined types were placed in this encounter.    *If you need refills on other medications prior to your next appointment, please contact your pharmacy*  Follow-Up: Call back or seek an  in-person evaluation if the symptoms worsen or if the condition fails to improve as anticipated.  [facility] [phone]  Other Instructions Follow up with primary provider in 24-48 hours. Report to nearest ER with any worsening symptoms.    If you have been instructed to have an in-person evaluation today at a local Urgent Care facility, please use the link below. It will take you to a list of all of our available [facility], including address, phone number and hours of operation. Please do not delay care.  [facility]  If you or a family member do not have a primary care provider, use the link below to schedule a visit and establish care. When you choose a [facility] primary care physician or advanced practice provider, you gain a long-term partner in health. Find a Primary Care Provider  Learn more about [facility]'s in-office and virtual care options: [facility] - Get Care Now

## 2024-06-09 DIAGNOSIS — E10319 Type 1 diabetes mellitus with unspecified diabetic retinopathy without macular edema: Secondary | ICD-10-CM | POA: Diagnosis not present

## 2024-06-26 ENCOUNTER — Other Ambulatory Visit: Payer: Self-pay

## 2024-06-27 ENCOUNTER — Other Ambulatory Visit: Payer: Self-pay

## 2024-07-22 ENCOUNTER — Other Ambulatory Visit: Payer: Self-pay

## 2024-07-30 ENCOUNTER — Other Ambulatory Visit: Payer: Self-pay

## 2024-08-04 NOTE — Unmapped External Note (Signed)
 Assessment Note   Demographics Verification - Call Monitoring - Confidentiality      Member Verification: Member Verification    Call Monitoring Disclaimer: Call Monitoring Disclaimer         Person Providing Info on the Call   Who is the person providing the information on the call? Member      Limitations/Preferences   What, if any, physical limitations, health literacy, language needs and learning preferences do you have that I should be aware of when we talk? Physical- Continuous Glucose Monitor, Physical- Insulin  Pump      Specify other language limitations        Specify other physical limitations        LCC Contact Type   Select the Health Your Way contact type: 4th or more contact-Telephonic Chronic Condition     New Diabetes Adult New Hypertension Adult Lehr Performance Guarantee                          General Health Perception   How would you describe your health in general? My health is good      SMART GOAL & SMART GOAL Follow Up     SMART GOAL    Did the member set a SMART goal? Yes  Did the member meet their previous SMART goal?  No      Brief Summary of Member Status   What medical conditions has your doctor diagnosed you with?    Past Medical History:  Diagnosis Date   Diabetes mellitus (CMS/HCC)    Hypertension      Did the member score via Care Engine for any condition(s) that he/she did NOT confirm?    HYW:  What medical conditions are you most concerned about and why?    In between MD visits people often find a need to go to the ER or an Urgent Care facilitycan you tell me if you had any concerns or issues in which you went to an ER or Urgent care in the last 12 months?      Condition Specific Metrics   Height/Weight/BMI  /Weight:  (unable to review due to length of call)/    Blood Pressure    Outcome Metrics  -     In the past 12 months, have you been told that your blood pressure was  difficult to control?  -- * completed 02/12/2024   Check off the hypertension outcome metrics or issues that were completed on this call:  Blood pressure control      -     Do you currently smoke cigarettes or have you been a smoker in the past month?  No         Medications   Current Medications[1]    There are no discontinued medications.     Medication Review   Medication Review  Does the member take 10 or more medications?: No Does the member meet any of the following criteria to trigger a medication review?: No ASK IF FULL MEDICATION REVIEW INDICATED: Is there any medication that your doctor has prescribed for you that you have never filled?: No ASK IF FULL MEDICATION REVIEW INDICATED: What issues, if any, sometimes interfere with your taking your medications on time or as prescribed?: No issues Does the member take any of the following medications?: Ace inhibitor/ARB, Statin Has the member been told that he/she should never take any of these medications, or is intolerant or allergic to any  of them?: None Grapefruit juice can affect some medications. Do you drink grapefruit juice or eat grapefruits?: No     Depression Screening PHQ2/PHQ9 Exclusions   PHQ2 EXCLUSION CRITERIA:  Do NOT administer the PHQ-2 if any of the following apply to this member.   PHQ9 EXCLUSION CRITERIA: To determine if this assessment is right for you, I need to ask you some questions before we start.  Besides depression, have you been diagnosed with any other mental health issues such as: No applicable exclusions              Depression PHQ2/PHQ9 Screening Results     PHQ2  The PHQ-2 questions are scored from 0 (not at all) to 3 (nearly every day) and then added together: Score 0-2 = Not likely to be at risk for depression, Score 3-6 = At risk for depression (further screening recommended).        PHQ9 PHQ-9 Mini Module-If diagnosis of depression- show confirmed dx of depression  and show PHQ-9 score & score description        Social Drivers of Health   Transportation Needs: No Transportation Needs (01/23/2024)   Received from [facility] - Transportation    In the past 12 months, has lack of transportation kept you from medical appointments or from getting medications?: No    In the past 12 months, has lack of transportation kept you from meetings, work, or from getting things needed for daily living?: No  Food Insecurity: No Food Insecurity (01/23/2024)   Received from [facility]   Hunger Vital Sign    Within the past 12 months, you worried that your food would run out before you got the money to buy more.: Never true    Within the past 12 months, the food you bought just didn't last and you didn't have money to get more.: Never true  Stress: No Stress Concern Present (01/23/2024)   Received from [facility] Occupational [facility] - Occupational Stress Questionnaire    Do you feel stress - tense, restless, nervous, or anxious, or unable to sleep at night because your mind is troubled all the time - these days?: Only a little  Tobacco Use: Low Risk (05/26/2024)   Received from [facility]   Patient History    Smoking Tobacco Use: Never    Smokeless Tobacco Use: Never  Utilities: Not At Risk (01/23/2024)   Received from [facility] Utilities    In the past 12 months has the electric, gas, oil, or water company threatened to shut off services in your home?: No  Health Literacy: Adequate Health Literacy (01/23/2024)   Received from [facility]   B1300 Health Literacy    How often do you need to have someone help you when you read instructions, pamphlets, or other written material from your doctor or pharmacy?: Never  Physical Activity: Insufficiently Active (01/23/2024)   Received from [facility]   Exercise Vital Sign    On average, how many days per week do you engage in moderate to strenuous exercise (like a brisk walk)?:  3 days    On average, how many minutes do you engage in exercise at this level?: 40 min  Financial Resource Strain: Low Risk (01/23/2024)   Received from [facility]   Overall Financial Resource Strain (CARDIA)    How hard is it for you to pay for the very basics like food, housing, medical care, and  heating?: Not hard at all      Lab Results   Results     There are no results available from this visit.         Immunizations   Immunizations Mini Module (show all Q/A pairs answered during this encounter) Immunization Have you had a flu shot in the past 12 months? (note that NASAL flu vaccine is contraindicated with asthma and all DM chronic conditions): Yes Have you received a pneumococcal vaccine (pneumonia shot)? You may have received this in your doctor's office, during a hospital discharge, pharmacy, clinic, etc.: Yes Have you had the updated seasonal COVID-19 vaccine?: No, I don't plan to get the updated vaccine.     Intervention/Actions   Education Topics Discussed -     In the past 3 months or since our last call, tell me about any of the following new or worsening symptoms of uncontrolled HTN or stroke/heart attack you have had  Review warning signs; Member voices good understanding of warning signs   Document the hypertension education and interventions provided on this call:  Blood Pressure      -     In the last 3 months or since our last call, tell me about any of the following symptoms of diabetes complications you may have had.  None          Referrals & Referral Follow Up          MEP/Mobile App Registration & Education on Tools/Resources   Is the member registered on the Member Engagement Platform (MEP) or Mobile App?  Explain tools and resources available on Member Engagement Platform [facility]) and how to get the most benefit out of them.  Indicate resources reviewed with member: Yes-registered on [facility] 02/07/2024    Rewards Center (if  applicable) HRA (if applicable) Letters and brochures from coach    Next Scheduled Appointment      Date Provider Department Visit Type   11/12/2024 4:00 PM Ellouise Novak Care Management [facility] Nurse Follow Up Assessment- 1st Attempt         Follow Up Needs Identified   Disease Management F/U Nursing Assessment   Member Name: Katie Griffin Call Number: 1 []  2 []  3 []  4+[x]  Health Conditions:  has a past medical history of Diabetes mellitus (CMS/HCC) and Hypertension. Member's Main Health Concerns: Diabetes, HTN GENERAL HEALTH: [] Poor  [] Fair  [x] Good  [] Very Good  [] Excellent  NOTE: Outbound call for scheduled appointment.   Medications and Medication Adherence reviewed.  Reviewed Diabetes warning s/s and complications; member has had < 5 episodes of feeling sweaty and tired with hypoglycemia in past 3 months; drinks a sugary beverage.  Has has thirst and frequent urination 1-2 times in past 3 months and did insulin  correction.  Denies s/s complications in past 3 months. Diagnosed with Diabetes Type I > 20 years ago. Understands Action Plan and Meds for Diabetes. BP normal.  Checks BP 2-3 times/week. Reviewed warning signs/symptoms of HTN, Stroke, Heart Attack; member denies having any warning signs/symptoms in past 3 months; would call 911 if experiences any warning signs/symptoms.  GOAL: I will drink 80ozs of water/day at least 5 days/week starting tomorrow until next call. CONFIDENCE: 8  DIABETES STATUS -A1C: 6.27 April 2024  -ADHERENCE: Yes -MANAGING BG AS DIRECTED BY MD: Yes -ORAL MEDS OR INSULIN : Insulin  -PUMP/CGM (include type): Dexcom/Omnipod -CONTROLLED/UNCONTROLLED: Controlled -ENGAGEMENT FREQUENCY: Quarterly -TERM DATE: 12/02/2024  Interventions addressed/Education Provided:  Education provided regarding effects of grapefruit on some medications.  Education provided regarding potential side effects of STATINS.  Updated BMI.  Used Motivational Interviewing  to increase member's motivation/confidence.  Provided member with support and encouragement  Reviewed goals from last call  Addressed barriers to change   POC: Goal Met/New Goal/Confidence, Diabetes/HTN warning s/s, BMI             [1]  Current Outpatient Medications:    atorvastatin  (LIPITOR) 10 MG tablet, Take 10 mg by mouth daily, Disp: , Rfl:    cholecalciferol, vitamin D3, (VITAMIN D3 ORAL), Take 50,000 Int'l Units by mouth once a week, Disp: , Rfl:    GLUCAGON  EMERGENCY KIT, HUMAN, INJ, Inject as directed., Disp: , Rfl:    hydroCHLOROthiazide  (HYDRODIURIL ) 12.5 MG tablet, Take 12.5 mg by mouth daily, Disp: , Rfl:    insulin  lispro (HumaLOG ) 100 unit/mL injection, Inject subcutaneously 3 (three) times a day before meals., Disp: , Rfl:    losartan  (COZAAR ) 25 MG tablet, Take 50 mg by mouth daily, Disp: , Rfl:    topiramate  (TOPAMAX ) 25 MG capsule, Take 75 mg by mouth daily, Disp: , Rfl:  *Some images could not be shown.

## 2024-08-10 ENCOUNTER — Emergency Department
Admission: EM | Admit: 2024-08-10 | Discharge: 2024-08-10 | Disposition: A | Discharge status: Home / Self Care | Attending: Emergency Medicine | Admitting: Emergency Medicine

## 2024-08-10 ENCOUNTER — Emergency Department

## 2024-08-10 ENCOUNTER — Other Ambulatory Visit: Payer: Self-pay

## 2024-08-10 DIAGNOSIS — E119 Type 2 diabetes mellitus without complications: Secondary | ICD-10-CM | POA: Diagnosis not present

## 2024-08-10 DIAGNOSIS — I10 Essential (primary) hypertension: Secondary | ICD-10-CM | POA: Diagnosis not present

## 2024-08-10 DIAGNOSIS — J069 Acute upper respiratory infection, unspecified: Secondary | ICD-10-CM | POA: Insufficient documentation

## 2024-08-10 DIAGNOSIS — R059 Cough, unspecified: Secondary | ICD-10-CM | POA: Diagnosis present

## 2024-08-10 LAB — RESP PANEL BY RT-PCR (RSV, FLU A&B, COVID)  RVPGX2
Influenza A by PCR: NEGATIVE
Influenza B by PCR: NEGATIVE
Resp Syncytial Virus by PCR: NEGATIVE
SARS Coronavirus 2 by RT PCR: NEGATIVE

## 2024-08-10 LAB — CBC
HCT: 36.5 % (ref 36.0–46.0)
Hemoglobin: 12.1 g/dL (ref 12.0–15.0)
MCH: 31.2 pg (ref 26.0–34.0)
MCHC: 33.2 g/dL (ref 30.0–36.0)
MCV: 94.1 fL (ref 80.0–100.0)
Platelets: 465 K/uL — ABNORMAL HIGH (ref 150–400)
RBC: 3.88 MIL/uL (ref 3.87–5.11)
RDW: 11.8 % (ref 11.5–15.5)
WBC: 9.9 K/uL (ref 4.0–10.5)
nRBC: 0 % (ref 0.0–0.2)

## 2024-08-10 LAB — BASIC METABOLIC PANEL WITH GFR
Anion gap: 9 (ref 5–15)
BUN: 11 mg/dL (ref 6–20)
CO2: 24 mmol/L (ref 22–32)
Calcium: 9.2 mg/dL (ref 8.9–10.3)
Chloride: 101 mmol/L (ref 98–111)
Creatinine, Ser: 0.82 mg/dL (ref 0.44–1.00)
GFR, Estimated: >60 mL/min
Glucose, Bld: 325 mg/dL — ABNORMAL HIGH (ref 70–99)
Potassium: 4.5 mmol/L (ref 3.5–5.1)
Sodium: 134 mmol/L — ABNORMAL LOW (ref 135–145)

## 2024-08-10 LAB — POC URINE PREG, ED: Preg Test, Ur: NEGATIVE

## 2024-08-10 MED ORDER — IPRATROPIUM-ALBUTEROL 0.5-2.5 (3) MG/3ML IN SOLN
3.0000 mL | Freq: Once | RESPIRATORY_TRACT | Status: AC
  Administered 2024-08-10: 3 mL via RESPIRATORY_TRACT
  Filled 2024-08-10: qty 3

## 2024-08-10 MED ORDER — ALBUTEROL SULFATE HFA 108 (90 BASE) MCG/ACT IN AERS: 2.0000 | INHALATION_SPRAY | Freq: Four times a day (QID) | RESPIRATORY_TRACT | 2 refills | Status: AC | PRN

## 2024-08-10 MED ORDER — HYDROCOD POLI-CHLORPHE POLI ER 10-8 MG/5ML PO SUER: 5.0000 mL | Freq: Every evening | ORAL | 0 refills | Status: AC | PRN

## 2024-08-10 NOTE — ED Triage Notes (Signed)
 Pt to ED for SOB like every time when I try to catch my breath, I start coughing real bad. Has had a cough for about 5 days. Respirations are unlabored. Denies hx asthma.

## 2024-08-10 NOTE — ED Notes (Signed)
 Patient transported to X-ray

## 2024-08-10 NOTE — ED Provider Notes (Signed)
 "  [facility] Provider Note    Event Date/Time   First MD Initiated Contact with Patient 08/10/24 1426     (approximate)   History   Cough and Shortness of Breath   HPI  Katie Griffin is a 39 y.o. female with history of diabetes, hypertension who presents with complaints of cough over the last 4 to 5 days, she reports it is productive.  Seems to have worsened last night she was having difficulty catching her breath because she was coughing so frequently.  No history of asthma, does not smoke.     Physical Exam   Triage Vital Signs: ED Triage Vitals  Encounter Vitals Group     BP 08/10/24 1418 (!) 183/105     Girls Systolic BP Percentile --      Girls Diastolic BP Percentile --      Boys Systolic BP Percentile --      Boys Diastolic BP Percentile --      Pulse Rate 08/10/24 1418 (!) 105     Resp 08/10/24 1418 20     Temp 08/10/24 1418 98.6 F (37 C)     Temp Source 08/10/24 1418 Oral     SpO2 08/10/24 1418 96 %     Weight 08/10/24 1416 104.3 kg (230 lb)     Height 08/10/24 1416 1.575 m (5' 2)     Head Circumference --      Peak Flow --      Pain Score 08/10/24 1416 0     Pain Loc --      Pain Education --      Exclude from Growth Chart --     Most recent vital signs: Vitals:   08/10/24 1538 08/10/24 1606  BP: (!) 157/116 (!) 138/98  Pulse: (!) 122 (!) 106  Resp: 20 20  Temp:    SpO2: 100% 100%     General: Awake, no distress.  CV:  Good peripheral perfusion.  Resp:  Normal effort.  Scattered mild wheezes Abd:  No distention.  Other:     ED Results / Procedures / Treatments   Labs (all labs ordered are listed, but only abnormal results are displayed) Labs Reviewed  BASIC METABOLIC PANEL WITH GFR - Abnormal; Notable for the following components:      Result Value   Sodium 134 (*)    Glucose, Bld 325 (*)    All other components within normal limits  CBC - Abnormal; Notable for the following components:   Platelets 465  (*)    All other components within normal limits  RESP PANEL BY RT-PCR (RSV, FLU A&B, COVID)  [id number]  POC URINE PREG, ED     EKG  ED ECG REPORT I, Lamar Price, the attending physician, personally viewed and interpreted this ECG.  Date: 08/10/2024  Rhythm: Sinus tachycardia QRS Axis: normal Intervals: normal ST/T Wave abnormalities: normal Narrative Interpretation: no evidence of acute ischemia    RADIOLOGY Chest x-ray view interpreted by me, no acute abnormality    PROCEDURES:  Critical Care performed:   Procedures   MEDICATIONS ORDERED IN ED: Medications  ipratropium-albuterol  (DUONEB) 0.5-2.5 (3) MG/3ML nebulizer solution 3 mL (3 mLs Nebulization Given 08/10/24 1504)  ipratropium-albuterol  (DUONEB) 0.5-2.5 (3) MG/3ML nebulizer solution 3 mL (3 mLs Nebulization Given 08/10/24 1504)     IMPRESSION / MDM / ASSESSMENT AND PLAN / ED COURSE  I reviewed the triage vital signs and the nursing notes. Patient's presentation is most consistent with  acute presentation with potential threat to life or bodily function.  Patient presents with shortness of breath, productive cough as detailed above, suspicious for possible pneumonia versus viral URI/bronchitis  Labs thus far reassuring, pending x-ray.  Will treat with DuoNebs in the meantime and reevaluate.   X-ray negative for pneumonia, respiratory panel negative, patient feeling better after DuoNebs.  Heart rate has improved.  Consistent with viral URI/bronchitis, given history of diabetes, no prednisone at this time, will give albuterol , cough medication     FINAL CLINICAL IMPRESSION(S) / ED DIAGNOSES   Final diagnoses:  Viral URI with cough     Rx / DC Orders   ED Discharge Orders          Ordered    chlorpheniramine-HYDROcodone  (TUSSIONEX) 10-8 MG/5ML  At bedtime PRN        08/10/24 1601    albuterol  (VENTOLIN  HFA) 108 (90 Base) MCG/ACT inhaler  Every 6 hours PRN        08/10/24 1601              Note:  This document was prepared using Dragon voice recognition software and may include unintentional dictation errors.   Arlander Charleston, MD 08/10/24 1609  "

## 2024-08-20 ENCOUNTER — Ambulatory Visit: Admitting: Family Medicine

## 2024-09-24 ENCOUNTER — Ambulatory Visit: Admitting: Family Medicine
